# Patient Record
Sex: Male | Born: 1935 | Race: Black or African American | Hispanic: No | Marital: Married | Smoking: Former smoker
Health system: Southern US, Community
[De-identification: ages and names within clinical notes are randomized; demographics above are authoritative.]

## PROBLEM LIST (undated history)

## (undated) DIAGNOSIS — G709 Myoneural disorder, unspecified: Secondary | ICD-10-CM

## (undated) DIAGNOSIS — C679 Malignant neoplasm of bladder, unspecified: Secondary | ICD-10-CM

## (undated) DIAGNOSIS — M1990 Unspecified osteoarthritis, unspecified site: Secondary | ICD-10-CM

## (undated) DIAGNOSIS — E119 Type 2 diabetes mellitus without complications: Secondary | ICD-10-CM

## (undated) DIAGNOSIS — I714 Abdominal aortic aneurysm, without rupture, unspecified: Secondary | ICD-10-CM

## (undated) DIAGNOSIS — N400 Enlarged prostate without lower urinary tract symptoms: Secondary | ICD-10-CM

## (undated) DIAGNOSIS — N189 Chronic kidney disease, unspecified: Secondary | ICD-10-CM

## (undated) DIAGNOSIS — I10 Essential (primary) hypertension: Secondary | ICD-10-CM

## (undated) HISTORY — DX: Type 2 diabetes mellitus without complications: E11.9

## (undated) HISTORY — PX: TONSILLECTOMY: SUR1361

## (undated) HISTORY — DX: Malignant neoplasm of bladder, unspecified: C67.9

## (undated) HISTORY — PX: EYE SURGERY: SHX253

## (undated) HISTORY — PX: APPENDECTOMY: SHX54

## (undated) HISTORY — DX: Benign prostatic hyperplasia without lower urinary tract symptoms: N40.0

---

## 2001-04-14 ENCOUNTER — Emergency Department (HOSPITAL_COMMUNITY): Admission: EM | Admit: 2001-04-14 | Discharge: 2001-04-14 | Payer: Self-pay | Admitting: Emergency Medicine

## 2001-04-14 ENCOUNTER — Encounter: Payer: Self-pay | Admitting: Emergency Medicine

## 2001-04-20 ENCOUNTER — Emergency Department (HOSPITAL_COMMUNITY): Admission: EM | Admit: 2001-04-20 | Discharge: 2001-04-20 | Payer: Self-pay | Admitting: *Deleted

## 2004-02-02 ENCOUNTER — Encounter: Admission: RE | Admit: 2004-02-02 | Discharge: 2004-02-02 | Payer: Self-pay | Admitting: Endocrinology

## 2004-02-15 ENCOUNTER — Encounter: Admission: RE | Admit: 2004-02-15 | Discharge: 2004-02-15 | Payer: Self-pay | Admitting: Urology

## 2004-02-16 ENCOUNTER — Ambulatory Visit (HOSPITAL_COMMUNITY): Admission: RE | Admit: 2004-02-16 | Discharge: 2004-02-16 | Payer: Self-pay | Admitting: Urology

## 2004-02-16 ENCOUNTER — Ambulatory Visit (HOSPITAL_BASED_OUTPATIENT_CLINIC_OR_DEPARTMENT_OTHER): Admission: RE | Admit: 2004-02-16 | Discharge: 2004-02-16 | Payer: Self-pay | Admitting: Urology

## 2004-03-06 ENCOUNTER — Emergency Department (HOSPITAL_COMMUNITY): Admission: EM | Admit: 2004-03-06 | Discharge: 2004-03-06 | Payer: Self-pay | Admitting: Emergency Medicine

## 2004-05-31 ENCOUNTER — Ambulatory Visit (HOSPITAL_BASED_OUTPATIENT_CLINIC_OR_DEPARTMENT_OTHER): Admission: RE | Admit: 2004-05-31 | Discharge: 2004-05-31 | Payer: Self-pay | Admitting: Urology

## 2004-05-31 ENCOUNTER — Ambulatory Visit (HOSPITAL_COMMUNITY): Admission: RE | Admit: 2004-05-31 | Discharge: 2004-05-31 | Payer: Self-pay | Admitting: Urology

## 2010-11-20 ENCOUNTER — Inpatient Hospital Stay (HOSPITAL_COMMUNITY)
Admission: EM | Admit: 2010-11-20 | Discharge: 2010-11-22 | DRG: 872 | Disposition: A | Discharge status: Home / Self Care | Payer: 59 | Attending: Pulmonary Disease | Admitting: Pulmonary Disease

## 2010-11-20 ENCOUNTER — Emergency Department (HOSPITAL_COMMUNITY): Payer: 59

## 2010-11-20 ENCOUNTER — Inpatient Hospital Stay (HOSPITAL_COMMUNITY): Payer: 59

## 2010-11-20 DIAGNOSIS — J069 Acute upper respiratory infection, unspecified: Secondary | ICD-10-CM | POA: Diagnosis present

## 2010-11-20 DIAGNOSIS — E119 Type 2 diabetes mellitus without complications: Secondary | ICD-10-CM | POA: Diagnosis present

## 2010-11-20 DIAGNOSIS — I2510 Atherosclerotic heart disease of native coronary artery without angina pectoris: Secondary | ICD-10-CM | POA: Diagnosis present

## 2010-11-20 DIAGNOSIS — I10 Essential (primary) hypertension: Secondary | ICD-10-CM | POA: Diagnosis present

## 2010-11-20 DIAGNOSIS — A419 Sepsis, unspecified organism: Secondary | ICD-10-CM | POA: Diagnosis present

## 2010-11-20 DIAGNOSIS — E860 Dehydration: Secondary | ICD-10-CM | POA: Diagnosis present

## 2010-11-20 DIAGNOSIS — R031 Nonspecific low blood-pressure reading: Secondary | ICD-10-CM | POA: Diagnosis present

## 2010-11-20 DIAGNOSIS — N179 Acute kidney failure, unspecified: Secondary | ICD-10-CM | POA: Diagnosis present

## 2010-11-20 LAB — DIFFERENTIAL
Basophils Absolute: 0 10*3/uL (ref 0.0–0.1)
Basophils Relative: 0 % (ref 0–1)
Eosinophils Absolute: 0 10*3/uL (ref 0.0–0.7)
Lymphs Abs: 0.5 10*3/uL — ABNORMAL LOW (ref 0.7–4.0)
Monocytes Relative: 4 % (ref 3–12)
Neutro Abs: 13.9 10*3/uL — ABNORMAL HIGH (ref 1.7–7.7)
Neutrophils Relative %: 93 % — ABNORMAL HIGH (ref 43–77)

## 2010-11-20 LAB — GLUCOSE, CAPILLARY
Glucose-Capillary: 89 mg/dL (ref 70–99)
Glucose-Capillary: 78 mg/dL (ref 70–99)
Glucose-Capillary: 154 mg/dL — ABNORMAL HIGH (ref 70–99)
Glucose-Capillary: 151 mg/dL — ABNORMAL HIGH (ref 70–99)

## 2010-11-20 LAB — POCT I-STAT, CHEM 8
BUN: 35 mg/dL — ABNORMAL HIGH (ref 6–23)
Calcium, Ion: 1.05 mmol/L — ABNORMAL LOW (ref 1.12–1.32)
Chloride: 103 mEq/L (ref 96–112)
Creatinine, Ser: 2.2 mg/dL — ABNORMAL HIGH (ref 0.50–1.35)
Glucose, Bld: 108 mg/dL — ABNORMAL HIGH (ref 70–99)
Sodium: 135 mEq/L (ref 135–145)

## 2010-11-20 LAB — CBC
HCT: 28 % — ABNORMAL LOW (ref 39.0–52.0)
HCT: 28.4 % — ABNORMAL LOW (ref 39.0–52.0)
Hemoglobin: 10 g/dL — ABNORMAL LOW (ref 13.0–17.0)
Hemoglobin: 10 g/dL — ABNORMAL LOW (ref 13.0–17.0)
MCH: 30.6 pg (ref 26.0–34.0)
MCH: 30.5 pg (ref 26.0–34.0)
MCHC: 35.7 g/dL (ref 30.0–36.0)
MCHC: 35.2 g/dL (ref 30.0–36.0)
MCV: 85.6 fL (ref 78.0–100.0)
MCV: 86.6 fL (ref 78.0–100.0)
Platelets: 137 10*3/uL — ABNORMAL LOW (ref 150–400)
Platelets: 143 10*3/uL — ABNORMAL LOW (ref 150–400)
RBC: 3.27 MIL/uL — ABNORMAL LOW (ref 4.22–5.81)
RBC: 3.28 MIL/uL — ABNORMAL LOW (ref 4.22–5.81)
RDW: 13.5 % (ref 11.5–15.5)
WBC: 15 10*3/uL — ABNORMAL HIGH (ref 4.0–10.5)
WBC: 23.9 10*3/uL — ABNORMAL HIGH (ref 4.0–10.5)

## 2010-11-20 LAB — BASIC METABOLIC PANEL
BUN: 32 mg/dL — ABNORMAL HIGH (ref 6–23)
CO2: 22 mEq/L (ref 19–32)
Calcium: 7.8 mg/dL — ABNORMAL LOW (ref 8.4–10.5)
Chloride: 105 mEq/L (ref 96–112)
Creatinine, Ser: 1.75 mg/dL — ABNORMAL HIGH (ref 0.50–1.35)
GFR calc Af Amer: 45 mL/min — ABNORMAL LOW (ref >60)
GFR calc non Af Amer: 37 mL/min — ABNORMAL LOW (ref >60)
Glucose, Bld: 77 mg/dL (ref 70–99)
Potassium: 3.7 mEq/L (ref 3.5–5.1)
Sodium: 135 mEq/L (ref 135–145)

## 2010-11-20 LAB — URINE MICROSCOPIC-ADD ON

## 2010-11-20 LAB — URINALYSIS, ROUTINE W REFLEX MICROSCOPIC
Glucose, UA: NEGATIVE mg/dL
Ketones, ur: NEGATIVE mg/dL
Leukocytes, UA: NEGATIVE
Nitrite: NEGATIVE
Protein, ur: 100 mg/dL — AB
Specific Gravity, Urine: 1.02 (ref 1.005–1.030)
Urobilinogen, UA: 2 mg/dL — ABNORMAL HIGH (ref 0.0–1.0)
pH: 5.5 (ref 5.0–8.0)

## 2010-11-20 LAB — POCT I-STAT TROPONIN I: Troponin i, poc: 0.15 ng/mL (ref 0.00–0.08)

## 2010-11-20 LAB — TROPONIN I: Troponin I: 0.66 ng/mL (ref <0.30)

## 2010-11-20 LAB — LACTIC ACID, PLASMA: Lactic Acid, Venous: 1 mmol/L (ref 0.5–2.2)

## 2010-11-21 ENCOUNTER — Inpatient Hospital Stay (HOSPITAL_COMMUNITY): Payer: 59

## 2010-11-21 DIAGNOSIS — R4182 Altered mental status, unspecified: Secondary | ICD-10-CM

## 2010-11-21 DIAGNOSIS — A419 Sepsis, unspecified organism: Secondary | ICD-10-CM

## 2010-11-21 LAB — URINE CULTURE: Colony Count: NO GROWTH

## 2010-11-21 LAB — GLUCOSE, CAPILLARY
Glucose-Capillary: 140 mg/dL — ABNORMAL HIGH (ref 70–99)
Glucose-Capillary: 150 mg/dL — ABNORMAL HIGH (ref 70–99)
Glucose-Capillary: 158 mg/dL — ABNORMAL HIGH (ref 70–99)
Glucose-Capillary: 163 mg/dL — ABNORMAL HIGH (ref 70–99)
Glucose-Capillary: 176 mg/dL — ABNORMAL HIGH (ref 70–99)

## 2010-11-21 LAB — BASIC METABOLIC PANEL
Calcium: 7.7 mg/dL — ABNORMAL LOW (ref 8.4–10.5)
Creatinine, Ser: 1.56 mg/dL — ABNORMAL HIGH (ref 0.50–1.35)
GFR calc Af Amer: 51 mL/min — ABNORMAL LOW (ref >60)
Potassium: 3.8 mEq/L (ref 3.5–5.1)
Sodium: 138 mEq/L (ref 135–145)

## 2010-11-21 LAB — CBC
MCH: 30.2 pg (ref 26.0–34.0)
MCHC: 35.1 g/dL (ref 30.0–36.0)
MCV: 86.2 fL (ref 78.0–100.0)
Platelets: 147 10*3/uL — ABNORMAL LOW (ref 150–400)
RDW: 14 % (ref 11.5–15.5)

## 2010-11-22 LAB — BASIC METABOLIC PANEL
CO2: 22 mEq/L (ref 19–32)
Calcium: 8.1 mg/dL — ABNORMAL LOW (ref 8.4–10.5)
GFR calc Af Amer: 54 mL/min — ABNORMAL LOW (ref >60)
GFR calc non Af Amer: 45 mL/min — ABNORMAL LOW (ref >60)
Sodium: 137 mEq/L (ref 135–145)

## 2010-11-22 LAB — CBC
MCH: 30.2 pg (ref 26.0–34.0)
Platelets: 190 10*3/uL (ref 150–400)
RBC: 3.31 MIL/uL — ABNORMAL LOW (ref 4.22–5.81)
RDW: 14.2 % (ref 11.5–15.5)

## 2010-11-22 LAB — GLUCOSE, CAPILLARY
Glucose-Capillary: 154 mg/dL — ABNORMAL HIGH (ref 70–99)
Glucose-Capillary: 169 mg/dL — ABNORMAL HIGH (ref 70–99)

## 2010-11-26 LAB — CULTURE, BLOOD (ROUTINE X 2)
Culture  Setup Time: 201209030831
Culture: NO GROWTH

## 2010-12-02 NOTE — Discharge Summary (Signed)
NAMEMarland Kitchen  Alexis Lopez, Alexis Lopez NO.:  [id number]  MEDICAL RECORD NO.:  [id number]  LOCATION:  6531                         FACILITY:  [facility]  PHYSICIAN:  Charlaine Dalton. Sherene Sires, MD, FCCPDATE OF BIRTH:  1921/01/22  DATE OF ADMISSION:  11/20/2010 DATE OF DISCHARGE:  11/22/2010                              DISCHARGE SUMMARY   DISCHARGE DIAGNOSES: 1. Systemic inflammatory response syndrome/sepsis. 2. Upper respiratory infection. 3. Acute renal failure. 4. Diabetes. 5. Hypertension.  HISTORY OF PRESENT ILLNESS:  Alexis Lopez is an 77 year old male with a history of diabetes and hypertension who presented on November 20, 2010 with fever and altered mental status which was transient and quickly resolved.  The patient also had transient hypotension in the emergency room and was therefore admitted to Pulmonary Critical Care with questionable sepsis.  LABORATORY DATA:  At the time of admission, CBC, white blood cells 15.0, hemoglobin 10.0, hematocrit 28, and platelets 137.  Urinalysis negative. Basic metabolic panel:  Sodium 135, potassium 4.0, glucose 108, BUN 35, and creatinine 2.2.  Lactic acid 1.0, troponin 0.66.  Procalcitonin 5.34.  Most recent laboratory data, November 22, 2010:  Basic metabolic panel:  Sodium 137, potassium 3.6, BUN 27, creatinine 1.48, and glucose 155.  CBC:  White blood cells 16.1, hemoglobin 10.0, hematocrit 28.4, and platelets 190.  MICRO DATA:  Urine culture:  Final report is no growth.  Blood cultures x2, preliminary report, no growth.  RADIOLOGIC DATA:  At the time of admission, portable chest x-ray shows infiltrate or atelectasis in the left lung base.  November 21, 2010 portable chest x-ray shows improved aeration of both lungs.  HOSPITAL COURSE BY DISCHARGE DIAGNOSES: 1. SIRS/sepsis was mild and is now resolved, likely secondary to upper     respiratory infection.  The patient did have some transient altered     mental status which was resolved  essentially by the time of     admission.  The patient was treated with IV antibiotics including     vancomycin and Zosyn and has been transitioned to p.o. antibiotics.     Please see next problem.  Blood pressure is back to his baseline.     Procalcitonin was 5.3 but lactate was negative. 2. Upper respiratory infection, question viral versus bronchitis.     Again, the patient was initially treated with IV antibiotics and     has been transitioned to p.o. Levaquin which he will continue for 5     days for a total of 7 days.  Procalcitonin was mildly elevated.     Lactate was negative.  The patient is afebrile at this time.  White     blood cells are trending down and respiratory status is stable on     room air. 3. Acute renal failure likely secondary to sepsis and dehydration,     appears resolved.  The patient's baseline creatinine actually     appears to be about 1.8 from 2006, creatinine today is 1.48 down     from 1.7.  The patient's Levaquin has been dosed at 250 mg p.o.     daily secondary to creatinine clearance. 4. Diabetes.  The patient was treated as  an inpatient with sliding     scale insulin and should continue his home insulin regimen and     follow up with his new primary care physician, Dr. Valentina Lucks. 5. Hypertension.  Again, the patient had some mild transient     hypotension which is now resolved.  She has been restarted on his     home antihypertensives except for his     triamterene/hydrochlorothiazide which should continue to be held in     the setting of questionable renal failure and unknown baseline     creatinine.  DISCHARGE MEDICATIONS: 1. Levaquin 250 mg p.o. q.24 h. x5 days. 2. Amlodipine 5 mg p.o. daily. 3. Aspirin 81 mg p.o. daily. 4. Doxazosin 8 mg p.o. daily. 5. Finasteride 5 mg p.o. daily. 6. Flomax 0.4 mg p.o. daily. 7. Insulin NPH 36 units subcutaneously twice daily before meals. 8. Regular insulin 14 units subcu twice daily before meals. 9. Zocor  20 mg p.o. at bedtime. 10.Vitamin D3 1000 units p.o. daily.  DISCHARGE ACTIVITY:  The patient should increase his activity slowly.  DISCHARGE DIET:  Low-sodium, heart-healthy and carbohydrate modified diabetic diet.  FOLLOWUP APPOINTMENTS:  The patient should follow up with Dr. Kirby Funk.  He has previously made an appointment to establish care with Dr. Valentina Lucks and this appointment is not until December.  I have spoken with the office which will call Alexis Lopez to set up a sooner appointment.  DISPOSITION:  The patient has met maximum benefit from his inpatient hospitalization.  He is medically cleared and ready for discharge to home.  He should follow up as an outpatient with Dr. Valentina Lucks for his diabetes and hypertension.  He will continue 5 further days of p.o. antibiotics for suspected upper respiratory infection.  TIME SPENT:  Greater 30 minutes were spent on this discharge.     Dirk Dress, NP   ______________________________ Charlaine Dalton. Sherene Sires, MD, FCCP    KW/MEDQ  D:  11/22/2010  T:  11/22/2010  Job:  [id number]  cc:   Thora Lance, M.D.  Electronically Signed by Danford Bad N.P. on 11/30/2010 03:28:38 PM Electronically Signed by Sandrea Hughs MD FCCP on 12/02/2010 08:25:07 AM

## 2012-04-11 ENCOUNTER — Other Ambulatory Visit: Payer: Self-pay | Admitting: Internal Medicine

## 2012-04-11 ENCOUNTER — Ambulatory Visit
Admission: RE | Admit: 2012-04-11 | Discharge: 2012-04-11 | Disposition: A | Discharge status: Home / Self Care | Payer: 59 | Source: Ambulatory Visit | Attending: Internal Medicine | Admitting: Internal Medicine

## 2012-04-11 DIAGNOSIS — R413 Other amnesia: Secondary | ICD-10-CM

## 2012-11-03 ENCOUNTER — Other Ambulatory Visit: Payer: Self-pay | Admitting: *Deleted

## 2012-11-03 MED ORDER — CALCITRIOL 0.25 MCG PO CAPS: 0.2500 ug | ORAL_CAPSULE | Freq: Every day | ORAL | Status: DC

## 2012-11-11 ENCOUNTER — Telehealth: Payer: Self-pay | Admitting: Endocrinology

## 2012-11-11 NOTE — Telephone Encounter (Signed)
Spoke with wife about calcitriol, instructions given, wife is aware

## 2012-11-20 ENCOUNTER — Other Ambulatory Visit: Payer: Self-pay | Admitting: *Deleted

## 2012-11-21 ENCOUNTER — Other Ambulatory Visit: Payer: Self-pay | Admitting: *Deleted

## 2012-11-21 DIAGNOSIS — E119 Type 2 diabetes mellitus without complications: Secondary | ICD-10-CM | POA: Insufficient documentation

## 2012-11-24 ENCOUNTER — Other Ambulatory Visit: Payer: 59

## 2012-11-24 ENCOUNTER — Ambulatory Visit: Payer: 59 | Admitting: Endocrinology

## 2013-01-23 ENCOUNTER — Other Ambulatory Visit (INDEPENDENT_AMBULATORY_CARE_PROVIDER_SITE_OTHER): Payer: 59

## 2013-01-23 DIAGNOSIS — E119 Type 2 diabetes mellitus without complications: Secondary | ICD-10-CM

## 2013-01-23 LAB — COMPREHENSIVE METABOLIC PANEL
AST: 26 U/L (ref 0–37)
Alkaline Phosphatase: 76 U/L (ref 39–117)
BUN: 18 mg/dL (ref 6–23)
Creatinine, Ser: 1.7 mg/dL — ABNORMAL HIGH (ref 0.4–1.5)
Total Bilirubin: 0.9 mg/dL (ref 0.3–1.2)

## 2013-01-23 LAB — URINALYSIS
Hgb urine dipstick: NEGATIVE
Leukocytes, UA: NEGATIVE
Specific Gravity, Urine: 1.02 (ref 1.000–1.030)
Urobilinogen, UA: 0.2 (ref 0.0–1.0)

## 2013-01-23 LAB — MICROALBUMIN / CREATININE URINE RATIO
Creatinine,U: 125.1 mg/dL
Microalb Creat Ratio: 10.5 mg/g (ref 0.0–30.0)
Microalb, Ur: 13.1 mg/dL — ABNORMAL HIGH (ref 0.0–1.9)

## 2013-01-28 ENCOUNTER — Encounter: Payer: Self-pay | Admitting: *Deleted

## 2013-01-28 ENCOUNTER — Ambulatory Visit: Payer: 59 | Admitting: Endocrinology

## 2013-03-02 ENCOUNTER — Other Ambulatory Visit (INDEPENDENT_AMBULATORY_CARE_PROVIDER_SITE_OTHER): Payer: 59

## 2013-03-02 ENCOUNTER — Other Ambulatory Visit: Payer: Self-pay | Admitting: *Deleted

## 2013-03-02 DIAGNOSIS — E119 Type 2 diabetes mellitus without complications: Secondary | ICD-10-CM

## 2013-03-02 LAB — URINALYSIS
Bilirubin Urine: NEGATIVE
Hgb urine dipstick: NEGATIVE
Ketones, ur: NEGATIVE
Urine Glucose: NEGATIVE
Urobilinogen, UA: 0.2 (ref 0.0–1.0)

## 2013-03-02 LAB — COMPREHENSIVE METABOLIC PANEL
AST: 24 U/L (ref 0–37)
Albumin: 3.8 g/dL (ref 3.5–5.2)
Alkaline Phosphatase: 79 U/L (ref 39–117)
BUN: 21 mg/dL (ref 6–23)
Potassium: 4.4 mEq/L (ref 3.5–5.1)
Sodium: 138 mEq/L (ref 135–145)
Total Bilirubin: 0.8 mg/dL (ref 0.3–1.2)
Total Protein: 6.9 g/dL (ref 6.0–8.3)

## 2013-03-02 LAB — LIPID PANEL
HDL: 33.9 mg/dL — ABNORMAL LOW (ref >39.00)
LDL Cholesterol: 118 mg/dL — ABNORMAL HIGH (ref 0–99)
Total CHOL/HDL Ratio: 5

## 2013-03-02 LAB — MICROALBUMIN / CREATININE URINE RATIO
Creatinine,U: 136.7 mg/dL
Microalb Creat Ratio: 10.5 mg/g (ref 0.0–30.0)
Microalb, Ur: 14.4 mg/dL — ABNORMAL HIGH (ref 0.0–1.9)

## 2013-03-04 ENCOUNTER — Ambulatory Visit (INDEPENDENT_AMBULATORY_CARE_PROVIDER_SITE_OTHER): Payer: 59 | Admitting: Endocrinology

## 2013-03-04 ENCOUNTER — Encounter: Payer: Self-pay | Admitting: Endocrinology

## 2013-03-04 VITALS — BP 164/72 | HR 66 | Temp 98.2°F | Resp 12 | Ht 66.0 in | Wt 189.9 lb

## 2013-03-04 DIAGNOSIS — I10 Essential (primary) hypertension: Secondary | ICD-10-CM

## 2013-03-04 DIAGNOSIS — E119 Type 2 diabetes mellitus without complications: Secondary | ICD-10-CM

## 2013-03-04 DIAGNOSIS — E785 Hyperlipidemia, unspecified: Secondary | ICD-10-CM | POA: Insufficient documentation

## 2013-03-04 NOTE — Patient Instructions (Addendum)
Please check blood sugars at least half the time about 2 hours after any meal and every 2 days on waking up.  Please bring blood sugar monitor to each visit  Check BP meds and call if on all

## 2013-03-04 NOTE — Progress Notes (Signed)
Patient ID: Alexis Lopez [facility], male   DOB: 26-Jul-1920, 79 y.o.   MRN: [id number]  Reason for Appointment: Diabetes follow-up   History of Present Illness   Diagnosis: Type 2 DIABETES MELITUS, date of diagnosis: 1980  Previous history: He has been on insulin for several years and on the same regimen of NPH and regular insulin This apparently has provided good control of his diabetes with fairly stable doses Previous records are not available  Recent history: He is concerned because his fasting blood sugars have been high for the last month or so. However his lab glucose and A1c are excellent He has not brought his blood sugar record and [facility]  He says that regard the strips about a year ago and does not know about expiration date Also is checking his blood sugars only in the morning     Oral hypoglycemic drugs: None        Side effects from medications: None Insulin regimen: 36 NPH 14 regular twice a day, usually right before eating         Proper timing of medications in relation to meals: Yes.          Monitors blood glucose: Once a day.    Glucometer: Precision           Blood Glucose readings from recall: readings before breakfast: 150-180  Hypoglycemia: None for quite some time         Meals: 3 meals per day.          Physical activity:  occasionally walking on treadmill           Weight control:  Wt Readings from Last 3 Encounters:  03/04/13 189 lb 14.4 oz (86.138 kg)        Last eye exam: 03/02/13, apparently no retinopathy  Complications:   nephropathy   Diabetes labs:  Lab Results  Component Value Date   HGBA1C 6.4 03/02/2013   HGBA1C 6.6* 01/23/2013   Lab Results  Component Value Date   MICROALBUR 14.4* 03/02/2013   LDLCALC 118* 03/02/2013   CREATININE 1.8* 03/02/2013    PROBLEM 2: Hypertension. He says he is taking half tablet of amlodipine 10 mg. Previous records are not available for confirmation. Blood  pressure is unusually high today. Does not monitor at home He does not think he is taking Maxzide which is on his list. He is taking Cardura. It is a little unsure about his medications and does not have his current list on him     Medication List       This list is accurate as of: 03/04/13  3:27 PM.  Always use your most recent med list.               amLODipine 10 MG tablet  Commonly known as:  NORVASC  Take 10 mg by mouth daily.     aspirin 81 MG tablet  Take 81 mg by mouth daily.     calcitRIOL 0.25 MCG capsule  Commonly known as:  ROCALTROL  Take 1 capsule (0.25 mcg total) by mouth daily.     cholecalciferol 400 UNITS Tabs tablet  Commonly known as:  VITAMIN D  Take 400 Units by mouth.     donepezil 10 MG tablet  Commonly known as:  ARICEPT  Take 10 mg by mouth at bedtime.     doxazosin 8 MG tablet  Commonly known as:  CARDURA  Take 8 mg by  mouth daily.     insulin NPH 100 UNIT/ML injection  Commonly known as:  HUMULIN N,NOVOLIN N  Inject 36 Units into the skin 2 (two) times daily.     insulin regular 100 units/mL injection  Commonly known as:  NOVOLIN R,HUMULIN R  Inject 14 Units into the skin 2 (two) times daily before a meal.     simvastatin 40 MG tablet  Commonly known as:  ZOCOR  Take 40 mg by mouth daily.     tamsulosin 0.4 MG Caps capsule  Commonly known as:  FLOMAX  Take 0.4 mg by mouth.     triamterene-hydrochlorothiazide 75-50 MG per tablet  Commonly known as:  MAXZIDE  Take 1 tablet by mouth daily.        Allergies: No Known Allergies  No past medical history on file.  No past surgical history on file.  No family history on file.  Social History:  reports that he has quit smoking. He started smoking about 44 years ago. He has never used smokeless tobacco. His alcohol and drug histories are not on file.  Above information will be completed when previous records are available  Review of Systems:   Has history of chronic renal  dysfunction followed by nephrologist periodically. Current GFR is 45  Lipids: He has [facility] been [facility] and LDL is high, previous records not available. Usually has no side effects with this  No complaints of numbness or tingling in his feet. He thinks he is followed by a podiatrist occasionally  No recent swelling of the feet. Asking about dry  skin on his feet  He is being treated by his PCP for memory loss with Aricept   Is being followed by a urologist for BPH     LABS:  Appointment on 03/02/2013  Component Date Value Range Status  . Sodium 03/02/2013 138  135 - 145 mEq/L Final  . Potassium 03/02/2013 4.4  3.5 - 5.1 mEq/L Final  . Chloride 03/02/2013 105  96 - 112 mEq/L Final  . CO2 03/02/2013 26  19 - 32 mEq/L Final  . Glucose, Bld 03/02/2013 116* 70 - 99 mg/dL Final  . BUN 40/98/1191 21  6 - 23 mg/dL Final  . Creatinine, Ser 03/02/2013 1.8* 0.4 - 1.5 mg/dL Final  . Total Bilirubin 03/02/2013 0.8  0.3 - 1.2 mg/dL Final  . Alkaline Phosphatase 03/02/2013 79  39 - 117 U/L Final  . AST 03/02/2013 24  0 - 37 U/L Final  . ALT 03/02/2013 13  0 - 53 U/L Final  . Total Protein 03/02/2013 6.9  6.0 - 8.3 g/dL Final  . Albumin 47/82/9562 3.8  3.5 - 5.2 g/dL Final  . Calcium 13/10/6576 8.8  8.4 - 10.5 mg/dL Final  . GFR 46/96/2952 45.23* >60.00 mL/min Final  . Color, Urine 03/02/2013 YELLOW  Yellow;Lt. Yellow Final  . APPearance 03/02/2013 CLEAR  Clear Final  . Specific Gravity, Urine 03/02/2013 1.020  1.000-1.030 Final  . pH 03/02/2013 5.5  5.0 - 8.0 Final  . Total Protein, Urine 03/02/2013 TRACE  Negative Final  . Urine Glucose 03/02/2013 NEGATIVE  Negative Final  . Ketones, ur 03/02/2013 NEGATIVE  Negative Final  . Bilirubin Urine 03/02/2013 NEGATIVE  Negative Final  . Hgb urine dipstick 03/02/2013 NEGATIVE  Negative Final  . Urobilinogen, UA 03/02/2013 0.2  0.0 - 1.0 Final  . Leukocytes, UA 03/02/2013 NEGATIVE   Negative Final  .  Nitrite 03/02/2013 NEGATIVE  Negative Final  . Cholesterol 03/02/2013 179  0 - 200 mg/dL Final   ATP III Classification       Desirable:  < 200 mg/dL               Borderline High:  200 - 239 mg/dL          High:  > = 213 mg/dL  . Triglycerides 03/02/2013 135.0  0.0 - 149.0 mg/dL Final   Normal:  <086 mg/dLBorderline High:  150 - 199 mg/dL  . HDL 03/02/2013 33.90* >39.00 mg/dL Final  . VLDL 57/84/6962 27.0  0.0 - 40.0 mg/dL Final  . LDL Cholesterol 03/02/2013 118* 0 - 99 mg/dL Final  . Total CHOL/HDL Ratio 03/02/2013 5   Final                  Men          Women1/2 Average Risk     3.4          3.3Average Risk          5.0          4.42X Average Risk          9.6          7.13X Average Risk          15.0          11.0                      . Microalb, Ur 03/02/2013 14.4* 0.0 - 1.9 mg/dL Final  . Creatinine,U 95/28/4132 136.7   Final  . Microalb Creat Ratio 03/02/2013 10.5  0.0 - 30.0 mg/g Final  . Hemoglobin A1C 03/02/2013 6.4  4.6 - 6.5 % Final   Glycemic Control Guidelines for People with Diabetes:Non Diabetic:  <6%Goal of Therapy: <7%Additional Action Suggested:  >8%      Examination:   BP 164/72  Pulse 66  Temp(Src) 98.2 F (36.8 C)  Resp 12  Ht 5\' 6"  (1.676 m)  Wt 189 lb 14.4 oz (86.138 kg)  BMI 30.67 kg/m2  SpO2 98%  Body mass index is 30.67 kg/(m^2).    Diabetic foot exam done No pedal edema seen  ASSESSMENT/ PLAN::    Diabetes type 2:    Blood glucose control appears excellent with upper -normal A1c and good blood sugar in the lab. He thinks his fasting readings are much higher but he probably is using expired [facility] Also appears that he is not checking his readings nonfasting and not keeping a record Is taking relatively high dose of insulin despite his renal dysfunction but has had no reported hypoglycemia  Have shown him how to use an Accu-Chek monitor and he will start getting the strips from the  local drugstore. Emphasized the need for taking some readings about 2 hours after meals and bring his meter for review in 1 month Meanwhile will continue the same insulin regimen, consider reducing the dose if blood sugars are low normal  Complications: Probable nephropathy. No evidence of significant neuropathy today. Discussed foot care principles  HYPERTENSION: He will take a full tablet of 10 mg Norvasc and bring his medications for review on the next visit  HYPERLIPIDEMIA: LDL is high, he will get his simvastatin refilled and take regularly  Incomplete records: His previous records are being requested and his note will be updated when this is available  Counseling time over 50% of today's 25 minute visit   Teriyah Purington 03/04/2013, 3:27 PM

## 2013-03-05 ENCOUNTER — Ambulatory Visit: Payer: 59 | Admitting: Endocrinology

## 2013-04-01 ENCOUNTER — Ambulatory Visit (INDEPENDENT_AMBULATORY_CARE_PROVIDER_SITE_OTHER): Payer: 59 | Admitting: Endocrinology

## 2013-04-01 ENCOUNTER — Other Ambulatory Visit: Payer: Self-pay | Admitting: *Deleted

## 2013-04-01 ENCOUNTER — Encounter: Payer: Self-pay | Admitting: Endocrinology

## 2013-04-01 VITALS — BP 128/52 | HR 64 | Temp 98.3°F | Resp 12 | Ht 66.0 in | Wt 191.1 lb

## 2013-04-01 DIAGNOSIS — E785 Hyperlipidemia, unspecified: Secondary | ICD-10-CM

## 2013-04-01 DIAGNOSIS — I10 Essential (primary) hypertension: Secondary | ICD-10-CM

## 2013-04-01 DIAGNOSIS — E119 Type 2 diabetes mellitus without complications: Secondary | ICD-10-CM

## 2013-04-01 DIAGNOSIS — N183 Chronic kidney disease, stage 3 unspecified: Secondary | ICD-10-CM

## 2013-04-01 MED ORDER — ACCU-CHEK FASTCLIX LANCETS MISC: Status: AC

## 2013-04-01 MED ORDER — GLUCOSE BLOOD VI STRP
ORAL_STRIP | Status: DC
Start: 2013-04-01 — End: 2013-12-07

## 2013-04-01 NOTE — Patient Instructions (Addendum)
Doxazosin, 1 daily and 1/2 Amlodipine 10mg  daily  Take Simvastatin daily

## 2013-04-01 NOTE — Progress Notes (Signed)
Patient ID: Alexis Lopez [facility], male   DOB: 11-06-1920, 80 y.o.   MRN: [id number]  Reason for Appointment: Diabetes follow-up   History of Present Illness   Diagnosis: Type 2 DIABETES MELITUS, date of diagnosis: 1980  Previous history: He has been on insulin for several years and on the same regimen of NPH and regular insulin This apparently has provided good control of his diabetes with fairly stable doses  A1c in 6/14 was 6.2  Recent history: He was apparently having falsely high readings on his last visit because of using expired strips  However his lab glucose and A1c are excellent He  again has not brought his blood sugar record and [facility]  His blood sugars seem to be better He says he is doing very well and trying to walk periodically also  He is checking his blood sugars only in the morning and sometimes before supper      Oral hypoglycemic drugs: None   Insulin regimen: 36 NPH 14 regular twice a day, usually right before eating         Proper timing of medications in relation to meals: Yes.          Monitors blood glucose: Once a day.    Glucometer: Precision; using new strips           Blood Glucose readings from recall: readings before breakfast: 140-145 am , supper about 130  Hypoglycemia: None for quite some time         Meals: 3 meals per day.          Physical activity:  occasionally walking on treadmill           Weight control:  Wt Readings from Last 3 Encounters:  04/01/13 191 lb 1.6 oz (86.682 kg)  03/04/13 189 lb 14.4 oz (86.138 kg)        Last eye exam: 03/02/13, apparently no retinopathy  Complications:  nephropathy/renal dysfunction, last microalbumin was 36   Diabetes labs:  Lab Results  Component Value Date   HGBA1C 6.4 03/02/2013   HGBA1C 6.6* 01/23/2013   Lab Results  Component Value Date   MICROALBUR 14.4* 03/02/2013   LDLCALC 118* 03/02/2013   CREATININE 1.8* 03/02/2013    PROBLEM  2: Hypertension. He was  taking half tablet of amlodipine 10 mg and.because of higher readings on the last visit this was increased to 10 mg.  Does not monitor  blood pressure at home He  is not sure if he is  taking Maxzide which is on his list. He is taking Cardura ? 8 mg. Blood pressure is improved     Medication List       This list is accurate as of: 04/01/13  5:08 PM.  Always use your most recent med list.               ACCU-CHEK FASTCLIX LANCETS Misc  Use to check blood sugars 2 times per day     amLODipine 10 MG tablet  Commonly known as:  NORVASC  Take 10 mg by mouth daily.     aspirin 81 MG tablet  Take 81 mg by mouth daily.     calcitRIOL 0.25 MCG capsule  Commonly known as:  ROCALTROL  Take 1 capsule (0.25 mcg total) by mouth daily.     cholecalciferol 400 UNITS Tabs tablet  Commonly known as:  VITAMIN D  Take 400 Units by mouth.  donepezil 10 MG tablet  Commonly known as:  ARICEPT  Take 10 mg by mouth at bedtime.     doxazosin 8 MG tablet  Commonly known as:  CARDURA  Take 8 mg by mouth daily.     glucose blood test strip  Commonly known as:  ACCU-CHEK SMARTVIEW  Use as instructed to check blood sugars 2 times per day, dx code 250.00     insulin NPH Human 100 UNIT/ML injection  Commonly known as:  HUMULIN N,NOVOLIN N  Inject 36 Units into the skin 2 (two) times daily.     insulin regular 100 units/mL injection  Commonly known as:  NOVOLIN R,HUMULIN R  Inject 14 Units into the skin 2 (two) times daily before a meal.     tamsulosin 0.4 MG Caps capsule  Commonly known as:  FLOMAX  Take 0.4 mg by mouth.     triamterene-hydrochlorothiazide 75-50 MG per tablet  Commonly known as:  MAXZIDE  Take 1 tablet by mouth daily.        Allergies: No Known Allergies  Past Medical History  Diagnosis Date  . BPH (benign prostatic hyperplasia)   . Bladder cancer     Past Surgical History  Procedure Laterality Date  . Eye surgery      Cataract     Family History  Problem Relation Age of Onset  . Diabetes Sister     Social History:  reports that he has quit smoking. He started smoking about 44 years ago. He has never used smokeless tobacco. His alcohol and drug histories are not on file.   Review of Systems:   Has history of chronic renal dysfunction followed by nephrologist periodically.  recent  GFR is 45  Lipids: He had been on pravastatin 20 mg for sometime but he is still unclear whether he has this at home. This does not appear on his list     LABS:  No visits with results within 1 Week(s) from this visit. Latest known visit with results is:  Office Visit on 03/04/2013  Component Date Value Range Status  . HM Diabetic Eye Exam 03/02/2013 Normal   Final     Examination:   BP 128/52  Pulse 64  Temp(Src) 98.3 F (36.8 C)  Resp 12  Ht 5\' 6"  (1.676 m)  Wt 191 lb 1.6 oz (86.682 kg)  BMI 30.86 kg/m2  SpO2 99%  Body mass index is 30.86 kg/(m^2).    Diabetic foot exam done No pedal edema seen  ASSESSMENT/ PLAN::    Diabetes type 2:    Blood glucose control  has been good and his recent readings are fairly good with using his new strips However have asked him to start using his Accu-Chek monitor and will send prescription for his strips This will be downloaded on the next visit Reminded him to check blood sugars at various times Meanwhile will continue the same insulin regimen, consider reducing the dose if blood sugars are low normal  HYPERTENSION: He will  continue same regimen but have him take only half of the 8 mg doxazosin because of his age and potential for orthostasis with this  HYPERLIPIDEMIA: LDL  recently  high, he will increase pravastatin to 40 mg   Alexis Lopez 04/01/2013, 5:08 PM

## 2013-05-29 ENCOUNTER — Other Ambulatory Visit (INDEPENDENT_AMBULATORY_CARE_PROVIDER_SITE_OTHER): Payer: 59

## 2013-05-29 DIAGNOSIS — E119 Type 2 diabetes mellitus without complications: Secondary | ICD-10-CM

## 2013-05-29 LAB — HEMOGLOBIN A1C: Hgb A1c MFr Bld: 6.6 % — ABNORMAL HIGH (ref 4.6–6.5)

## 2013-05-29 LAB — COMPREHENSIVE METABOLIC PANEL
ALT: 14 U/L (ref 0–53)
AST: 22 U/L (ref 0–37)
Albumin: 3.7 g/dL (ref 3.5–5.2)
Alkaline Phosphatase: 78 U/L (ref 39–117)
BUN: 21 mg/dL (ref 6–23)
CHLORIDE: 109 meq/L (ref 96–112)
CO2: 24 meq/L (ref 19–32)
CREATININE: 1.8 mg/dL — AB (ref 0.4–1.5)
Calcium: 8.9 mg/dL (ref 8.4–10.5)
GFR: 44.64 mL/min — AB (ref >60.00)
Glucose, Bld: 79 mg/dL (ref 70–99)
Potassium: 4.4 mEq/L (ref 3.5–5.1)
Sodium: 140 mEq/L (ref 135–145)
Total Bilirubin: 0.7 mg/dL (ref 0.3–1.2)
Total Protein: 6.9 g/dL (ref 6.0–8.3)

## 2013-05-29 LAB — LIPID PANEL
CHOLESTEROL: 147 mg/dL (ref 0–200)
HDL: 39.5 mg/dL (ref >39.00)
LDL Cholesterol: 94 mg/dL (ref 0–99)
Total CHOL/HDL Ratio: 4
Triglycerides: 67 mg/dL (ref 0.0–149.0)
VLDL: 13.4 mg/dL (ref 0.0–40.0)

## 2013-06-03 ENCOUNTER — Ambulatory Visit (INDEPENDENT_AMBULATORY_CARE_PROVIDER_SITE_OTHER): Payer: 59 | Admitting: Endocrinology

## 2013-06-03 ENCOUNTER — Encounter: Payer: Self-pay | Admitting: Endocrinology

## 2013-06-03 VITALS — BP 112/60 | HR 53 | Temp 98.5°F | Resp 14 | Ht 66.0 in | Wt 192.4 lb

## 2013-06-03 DIAGNOSIS — I10 Essential (primary) hypertension: Secondary | ICD-10-CM

## 2013-06-03 DIAGNOSIS — E119 Type 2 diabetes mellitus without complications: Secondary | ICD-10-CM

## 2013-06-03 DIAGNOSIS — E785 Hyperlipidemia, unspecified: Secondary | ICD-10-CM

## 2013-06-03 DIAGNOSIS — N183 Chronic kidney disease, stage 3 unspecified: Secondary | ICD-10-CM

## 2013-06-03 NOTE — Patient Instructions (Signed)
Please check blood sugars at least half the time about 2 hours after any meal and as directed on waking up. Please bring blood sugar monitor to each visit  Reduce triamterene-hydrochlorothiazide (MAXZIDE) 75-50 MG to 1/2

## 2013-06-03 NOTE — Progress Notes (Signed)
Patient ID: Alexis Lopez [facility], male   DOB: 08/14/1920, 80 y.o.   MRN: [id number]  Reason for Appointment: Diabetes follow-up   History of Present Illness   Diagnosis: Type 2 DIABETES MELITUS, date of diagnosis: 1980  Previous history: He has been on insulin for several years and on the same regimen of NPH and regular insulin This apparently has provided good control of his diabetes with fairly stable doses  A1c in 6/14 was 6.2  Recent history:  His blood sugars appear to be reasonably good at home although his record indicates only he is checking in the morning He thinks he forgets to write down the blood sugars in the afternoon Again is not checking any readings after meals He thinks he is checking glucose with his Accu-Chek meter now Again his lab glucose and A1c are good level He has been a little active but has not lost any weight  Oral hypoglycemic drugs: None   Insulin regimen: 36 NPH 14 regular twice a day, usually right before eating         Proper timing of medications in relation to meals: Yes.          Monitors blood glucose: Once a day.    Glucometer: ? Accu-Chek           Blood Glucose readings from home diary: readings before breakfast: 77-164, none on fasting readings recorded   Hypoglycemia: None for quite some time         Meals: 3 meals per day.          Physical activity:  occasionally walking on treadmill           Weight control:  Wt Readings from Last 3 Encounters:  06/03/13 192 lb 6.4 oz (87.272 kg)  04/01/13 191 lb 1.6 oz (86.682 kg)  03/04/13 189 lb 14.4 oz (86.138 kg)        Last eye exam: 03/02/13, apparently no retinopathy  Complications:  nephropathy/renal dysfunction, last microalbumin was 36   Diabetes labs:  Lab Results  Component Value Date   HGBA1C 6.6* 05/29/2013   HGBA1C 6.4 03/02/2013   HGBA1C 6.6* 01/23/2013   Lab Results  Component Value Date   MICROALBUR 14.4* 03/02/2013   LDLCALC 94 05/29/2013   CREATININE 1.8* 05/29/2013     PROBLEM 2: Hypertension. He is again unsure about what medications he is taking He was previously on indapamide but his medication list indicates he is now taking Maxzide 50 Also he is taking amlodipine; Cardura was reduced to half tablet on the last visit to avoid potential orthostasis Blood pressure is relatively low today, no lightheadedness on standing up and no monitoring at home     Medication List       This list is accurate as of: 06/03/13  1:27 PM.  Always use your most recent med list.               ACCU-CHEK FASTCLIX LANCETS Misc  Use to check blood sugars 2 times per day     amLODipine 10 MG tablet  Commonly known as:  NORVASC  Take 10 mg by mouth daily.     aspirin 81 MG tablet  Take 81 mg by mouth daily.     calcitRIOL 0.25 MCG capsule  Commonly known as:  ROCALTROL  Take 1 capsule (0.25 mcg total) by mouth daily.     cholecalciferol 400 UNITS Tabs tablet  Commonly known as:  VITAMIN D  Take 400 Units by mouth.  donepezil 10 MG tablet  Commonly known as:  ARICEPT  Take 10 mg by mouth at bedtime.     doxazosin 8 MG tablet  Commonly known as:  CARDURA  Take 8 mg by mouth daily.     glucose blood test strip  Commonly known as:  ACCU-CHEK SMARTVIEW  Use as instructed to check blood sugars 2 times per day, dx code 250.00     insulin NPH Human 100 UNIT/ML injection  Commonly known as:  HUMULIN N,NOVOLIN N  Inject 36 Units into the skin 2 (two) times daily.     insulin regular 100 units/mL injection  Commonly known as:  NOVOLIN R,HUMULIN R  Inject 14 Units into the skin 2 (two) times daily before a meal.     tamsulosin 0.4 MG Caps capsule  Commonly known as:  FLOMAX  Take 0.4 mg by mouth.     triamterene-hydrochlorothiazide 75-50 MG per tablet  Commonly known as:  MAXZIDE  Take 1 tablet by mouth daily.        Allergies: No Known Allergies  Past Medical History  Diagnosis Date  . BPH (benign prostatic hyperplasia)   . Bladder cancer      Past Surgical History  Procedure Laterality Date  . Eye surgery      Cataract    Family History  Problem Relation Age of Onset  . Diabetes Sister     Social History:  reports that he has quit smoking. He started smoking about 45 years ago. He has never used smokeless tobacco. His alcohol and drug histories are not on file.   Review of Systems:   Has history of chronic renal dysfunction followed by nephrologist periodically.  recent  GFR is 45  Lipids: He had been on pravastatin 20 mg previously but he is still unclear whether he has this at home. This does not appear on his list However his LDL is below 100     LABS:  Appointment on 05/29/2013  Component Date Value Ref Range Status  . Hemoglobin A1C 05/29/2013 6.6* 4.6 - 6.5 % Final   Glycemic Control Guidelines for People with Diabetes:Non Diabetic:  <6%Goal of Therapy: <7%Additional Action Suggested:  >8%   . Sodium 05/29/2013 140  135 - 145 mEq/L Final  . Potassium 05/29/2013 4.4  3.5 - 5.1 mEq/L Final  . Chloride 05/29/2013 109  96 - 112 mEq/L Final  . CO2 05/29/2013 24  19 - 32 mEq/L Final  . Glucose, Bld 05/29/2013 79  70 - 99 mg/dL Final  . BUN 05/29/2013 21  6 - 23 mg/dL Final  . Creatinine, Ser 05/29/2013 1.8* 0.4 - 1.5 mg/dL Final  . Total Bilirubin 05/29/2013 0.7  0.3 - 1.2 mg/dL Final  . Alkaline Phosphatase 05/29/2013 78  39 - 117 U/L Final  . AST 05/29/2013 22  0 - 37 U/L Final  . ALT 05/29/2013 14  0 - 53 U/L Final  . Total Protein 05/29/2013 6.9  6.0 - 8.3 g/dL Final  . Albumin 05/29/2013 3.7  3.5 - 5.2 g/dL Final  . Calcium 05/29/2013 8.9  8.4 - 10.5 mg/dL Final  . GFR 05/29/2013 44.64* >60.00 mL/min Final  . Cholesterol 05/29/2013 147  0 - 200 mg/dL Final   ATP III Classification       Desirable:  < 200 mg/dL               Borderline High:  200 - 239 mg/dL  High:  > = 240 mg/dL  . Triglycerides 05/29/2013 67.0  0.0 - 149.0 mg/dL Final   Normal:  <150 mg/dLBorderline High:  150 - 199 mg/dL   . HDL 05/29/2013 39.50  >39.00 mg/dL Final  . VLDL 05/29/2013 13.4  0.0 - 40.0 mg/dL Final  . LDL Cholesterol 05/29/2013 94  0 - 99 mg/dL Final  . Total CHOL/HDL Ratio 05/29/2013 4   Final                  Men          Women1/2 Average Risk     3.4          3.3Average Risk          5.0          4.42X Average Risk          9.6          7.13X Average Risk          15.0          11.0                         Diabetic foot exam done in 12/14    Examination:   BP 112/60  Pulse 53  Temp(Src) 98.5 F (36.9 C)  Resp 14  Ht 5\' 6"  (1.676 m)  Wt 192 lb 6.4 oz (87.272 kg)  BMI 31.07 kg/m2  SpO2 98%  Body mass index is 31.07 kg/(m^2).   No pedal edema seen, no skin lesions on soles of feet  ASSESSMENT/ PLAN::    Diabetes type 2:    Blood glucose control  has been good and his recent readings in the mornings are fairly good Discussed timing of glucose monitoring and showed him how to rotate the times of testing throughout the day instead of just in the morning However have asked him to continue using his Accu-Chek monitor  This will be downloaded on the next visit Meanwhile will continue the same insulin regimen, consider reducing the dose if blood sugars are low normal  HYPERTENSION: He will  continue same regimen but have him take only half of the 50 mg Maxzide because of his renal dysfunction  HYPERLIPIDEMIA: LDL is well-controlled compared to previous levels, most likely he is taking his pravastatin   Allean Montfort 06/03/2013, 1:27 PM

## 2013-08-25 ENCOUNTER — Telehealth: Payer: Self-pay | Admitting: *Deleted

## 2013-08-25 NOTE — Telephone Encounter (Signed)
Sent a fax requesting A1C and other labs just following on this please call her if possible she is the case worker

## 2013-08-31 ENCOUNTER — Other Ambulatory Visit (INDEPENDENT_AMBULATORY_CARE_PROVIDER_SITE_OTHER): Payer: 59

## 2013-08-31 DIAGNOSIS — N183 Chronic kidney disease, stage 3 unspecified: Secondary | ICD-10-CM

## 2013-08-31 DIAGNOSIS — E119 Type 2 diabetes mellitus without complications: Secondary | ICD-10-CM

## 2013-08-31 LAB — COMPREHENSIVE METABOLIC PANEL
ALT: 11 U/L (ref 0–53)
AST: 19 U/L (ref 0–37)
Albumin: 3.9 g/dL (ref 3.5–5.2)
Alkaline Phosphatase: 75 U/L (ref 39–117)
BUN: 24 mg/dL — ABNORMAL HIGH (ref 6–23)
CO2: 23 mEq/L (ref 19–32)
CREATININE: 2 mg/dL — AB (ref 0.4–1.5)
Calcium: 8.7 mg/dL (ref 8.4–10.5)
Chloride: 104 mEq/L (ref 96–112)
GFR: 40.75 mL/min — ABNORMAL LOW (ref >60.00)
GLUCOSE: 142 mg/dL — AB (ref 70–99)
Potassium: 4.1 mEq/L (ref 3.5–5.1)
Sodium: 135 mEq/L (ref 135–145)
Total Bilirubin: 0.6 mg/dL (ref 0.2–1.2)
Total Protein: 7.2 g/dL (ref 6.0–8.3)

## 2013-08-31 LAB — CBC
HEMATOCRIT: 37.7 % — AB (ref 39.0–52.0)
Hemoglobin: 12.5 g/dL — ABNORMAL LOW (ref 13.0–17.0)
MCHC: 33.2 g/dL (ref 30.0–36.0)
MCV: 91.4 fl (ref 78.0–100.0)
PLATELETS: 146 10*3/uL — AB (ref 150.0–400.0)
RBC: 4.13 Mil/uL — AB (ref 4.22–5.81)
RDW: 13.8 % (ref 11.5–15.5)
WBC: 6.6 10*3/uL (ref 4.0–10.5)

## 2013-08-31 LAB — HEMOGLOBIN A1C: Hgb A1c MFr Bld: 6.8 % — ABNORMAL HIGH (ref 4.6–6.5)

## 2013-09-01 ENCOUNTER — Telehealth: Payer: Self-pay | Admitting: *Deleted

## 2013-09-01 NOTE — Telephone Encounter (Signed)
No, would worsen kidney function

## 2013-09-01 NOTE — Telephone Encounter (Signed)
Alexis Lopez from [facility] Case management, she wants to know if you think he would benefit from being on an ace inhibitor since his last creatine was 1.8. She wants to know if you would consider that? CB # [phone], ext [phone]

## 2013-09-03 ENCOUNTER — Encounter: Payer: Self-pay | Admitting: Endocrinology

## 2013-09-03 ENCOUNTER — Ambulatory Visit (INDEPENDENT_AMBULATORY_CARE_PROVIDER_SITE_OTHER): Payer: 59 | Admitting: Endocrinology

## 2013-09-03 VITALS — BP 152/64 | HR 59 | Temp 98.0°F | Resp 16 | Ht 66.0 in | Wt 189.8 lb

## 2013-09-03 DIAGNOSIS — N183 Chronic kidney disease, stage 3 unspecified: Secondary | ICD-10-CM

## 2013-09-03 DIAGNOSIS — E119 Type 2 diabetes mellitus without complications: Secondary | ICD-10-CM

## 2013-09-03 DIAGNOSIS — I10 Essential (primary) hypertension: Secondary | ICD-10-CM

## 2013-09-03 NOTE — Patient Instructions (Signed)
Doxazosin 8mg  full tab daily  Please check blood sugars at least half the time about 2 hours after any meal and times per week on waking up. Please bring blood sugar monitor to each visit

## 2013-09-03 NOTE — Progress Notes (Signed)
Patient ID: Alexis Lopez [facility], male   DOB: Aug 21, 1920, 80 y.o.   MRN: [id number]  Reason for Appointment: Diabetes follow-up   History of Present Illness   Diagnosis: Type 2 DIABETES MELITUS, date of diagnosis: 1980  Previous history: He has been on insulin for several years and on the same regimen of NPH and regular insulin This apparently has provided good control of his diabetes with fairly stable doses  A1c in 6/14 was 6.2  Recent history:  His blood sugars appear to be reasonably good at home although higher in the morning recently His record indicates  he is checking in the morning mostly. Again did not use the Accu-Chek that was prescribed Again is not checking any readings after meals A1c is below 7% and probably adequate for his age He has been taking the same regimen of insulin for several years and usually does not like to change the dose, appears to be benefiting from taking the same dose twice a day He does exercise only occasionally, weight is slightly better  Oral hypoglycemic drugs: None   Insulin regimen: 36 NPH 14 regular twice a day, usually right before eating         Proper timing of medications in relation to meals: Yes.          Monitors blood glucose: Once a day.    Glucometer: ? Precision         Blood Glucose readings from home diary: readings before breakfast: 80-173. acs 93-197, not checked recently His blood sugars are recently relatively higher in the morning   Hypoglycemia: None         Meals: 3 meals per day.          Physical activity:  occasionally walking on treadmill           Weight control:  Wt Readings from Last 3 Encounters:  09/03/13 189 lb 12.8 oz (86.093 kg)  06/03/13 192 lb 6.4 oz (87.272 kg)  04/01/13 191 lb 1.6 oz (86.682 kg)        Last eye exam: 03/02/13, apparently no retinopathy  Complications:  nephropathy/renal dysfunction, last microalbumin was 36   Diabetes labs:  Lab Results  Component Value Date   HGBA1C 6.8* 08/31/2013    HGBA1C 6.6* 05/29/2013   HGBA1C 6.4 03/02/2013   Lab Results  Component Value Date   MICROALBUR 14.4* 03/02/2013   LDLCALC 94 05/29/2013   CREATININE 2.0* 08/31/2013     PROBLEM 2: Hypertension.  Currently he is taking amlodipine; Cardura was reduced to half tablet previously to avoid potential orthostasis Blood pressure is higher than usual, previously has been low normal Does not do any monitoring at home but [facility]  Does not have any microalbuminuria     Medication List       This list is accurate as of: 09/03/13  2:43 PM.  Always use your most recent med list.               ACCU-CHEK FASTCLIX LANCETS Misc  Use to check blood sugars 2 times per day     amLODipine 10 MG tablet  Commonly known as:  NORVASC  Take 10 mg by mouth daily.     aspirin 81 MG tablet  Take 81 mg by mouth daily.     brimonidine 0.15 % ophthalmic solution  Commonly known as:  ALPHAGAN     calcitRIOL 0.25 MCG capsule  Commonly known as:  ROCALTROL  Take 1 capsule (0.25 mcg total) by mouth daily.     cholecalciferol 400 UNITS Tabs tablet  Commonly known as:  VITAMIN D  Take 400 Units by mouth.     donepezil 10 MG tablet  Commonly known as:  ARICEPT  Take 10 mg by mouth at bedtime.     doxazosin 8 MG tablet  Commonly known as:  CARDURA  Take 8 mg by mouth daily.     glucose blood test strip  Commonly known as:  ACCU-CHEK SMARTVIEW  Use as instructed to check blood sugars 2 times per day, dx code 250.00     insulin NPH Human 100 UNIT/ML injection  Commonly known as:  HUMULIN N,NOVOLIN N  Inject 36 Units into the skin 2 (two) times daily.     insulin regular 100 units/mL injection  Commonly known as:  NOVOLIN R,HUMULIN R  Inject 14 Units into the skin 2 (two) times daily before a meal.     pravastatin 40 MG tablet  Commonly known as:  PRAVACHOL  Take 40 mg by mouth daily.     tamsulosin 0.4 MG Caps capsule  Commonly  known as:  FLOMAX  Take 0.4 mg by mouth.        Allergies: No Known Allergies  Past Medical History  Diagnosis Date  . BPH (benign prostatic hyperplasia)   . Bladder cancer     Past Surgical History  Procedure Laterality Date  . Eye surgery      Cataract    Family History  Problem Relation Age of Onset  . Diabetes Sister     Social History:  reports that he has quit smoking. He started smoking about 45 years ago. He has never used smokeless tobacco. His alcohol and drug histories are not on file.   Review of Systems:   Has history of chronic renal dysfunction followed by nephrologist periodically.  recent  GFR is 41  Lipids: He says he is taking 2 tablets of pravastatin from the [facility], not clear what dose he is taking  Lab Results  Component Value Date   CHOL 147 05/29/2013   HDL 39.50 05/29/2013   LDLCALC 94 05/29/2013   TRIG 67.0 05/29/2013   CHOLHDL 4 05/29/2013        LABS:  Appointment on 08/31/2013  Component Date Value Ref Range Status  . Hemoglobin A1C 08/31/2013 6.8* 4.6 - 6.5 % Final   Glycemic Control Guidelines for People with Diabetes:Non Diabetic:  <6%Goal of Therapy: <7%Additional Action Suggested:  >8%   . Sodium 08/31/2013 135  135 - 145 mEq/L Final  . Potassium 08/31/2013 4.1  3.5 - 5.1 mEq/L Final  . Chloride 08/31/2013 104  96 - 112 mEq/L Final  . CO2 08/31/2013 23  19 - 32 mEq/L Final  . Glucose, Bld 08/31/2013 142* 70 - 99 mg/dL Final  . BUN 08/31/2013 24* 6 - 23 mg/dL Final  . Creatinine, Ser 08/31/2013 2.0* 0.4 - 1.5 mg/dL Final  . Total Bilirubin 08/31/2013 0.6  0.2 - 1.2 mg/dL Final  . Alkaline Phosphatase 08/31/2013 75  39 - 117 U/L Final  . AST 08/31/2013 19  0 - 37 U/L Final  . ALT 08/31/2013 11  0 - 53 U/L Final  . Total Protein 08/31/2013 7.2  6.0 - 8.3 g/dL Final  . Albumin 08/31/2013 3.9  3.5 - 5.2 g/dL Final  . Calcium 08/31/2013 8.7  8.4 - 10.5 mg/dL Final  . GFR 08/31/2013 40.75* >60.00 mL/min Final  .  WBC  08/31/2013 6.6  4.0 - 10.5 K/uL Final  . RBC 08/31/2013 4.13* 4.22 - 5.81 Mil/uL Final  . Platelets 08/31/2013 146.0* 150.0 - 400.0 K/uL Final  . Hemoglobin 08/31/2013 12.5* 13.0 - 17.0 g/dL Final  . HCT 08/31/2013 37.7* 39.0 - 52.0 % Final  . MCV 08/31/2013 91.4  78.0 - 100.0 fl Final  . MCHC 08/31/2013 33.2  30.0 - 36.0 g/dL Final  . RDW 08/31/2013 13.8  11.5 - 15.5 % Final     Diabetic foot exam done in 12/14    Examination:   BP 152/64  Pulse 59  Temp(Src) 98 F (36.7 C)  Resp 16  Ht 5\' 6"  (1.676 m)  Wt 189 lb 12.8 oz (86.093 kg)  BMI 30.65 kg/m2  SpO2 96%  Body mass index is 30.65 kg/(m^2).   No pedal edema seen   150/74 standing  ASSESSMENT/ PLAN:    Diabetes type 2:    Blood glucose control  has been reasonably good and his recent readings in the mornings are somewhat variable but recently higher However his A1c is below 7% which is adequate Since he is fairly set in his ways as far as checking blood sugars and insulin doses will not make any changes He tends to forget to check his blood sugars in the afternoons and evenings and none after meals despite reminders [facility] Encouraged him to walk more regularly on the treadmill  HYPERTENSION: He will  increase Cardura to 8 mg with blood pressure being high normal considering his age  CKD: Creatinine is about the same. No significant anemia related to his renal dysfunction Calcium is normal with calcitriol, presumably PTH is being checked by a nephrologist  HYPERLIPIDEMIA: LDL is well-controlled as of 3/15   [facility] 09/03/2013, 2:43 PM

## 2013-12-07 ENCOUNTER — Other Ambulatory Visit: Payer: Self-pay | Admitting: *Deleted

## 2013-12-07 ENCOUNTER — Telehealth: Payer: Self-pay | Admitting: Endocrinology

## 2013-12-07 MED ORDER — GLUCOSE BLOOD VI STRP: ORAL_STRIP | Status: DC

## 2013-12-07 NOTE — Telephone Encounter (Signed)
Pt needs rx for test strips accucheck nano smartview please call in soon

## 2013-12-29 ENCOUNTER — Other Ambulatory Visit: Payer: 59

## 2014-01-01 ENCOUNTER — Ambulatory Visit (INDEPENDENT_AMBULATORY_CARE_PROVIDER_SITE_OTHER): Payer: 59 | Admitting: Endocrinology

## 2014-01-01 ENCOUNTER — Encounter: Payer: Self-pay | Admitting: Endocrinology

## 2014-01-01 VITALS — BP 126/62 | HR 60 | Temp 98.0°F | Resp 14 | Ht 66.0 in | Wt 191.4 lb

## 2014-01-01 DIAGNOSIS — I10 Essential (primary) hypertension: Secondary | ICD-10-CM

## 2014-01-01 DIAGNOSIS — E119 Type 2 diabetes mellitus without complications: Secondary | ICD-10-CM

## 2014-01-01 DIAGNOSIS — N183 Chronic kidney disease, stage 3 unspecified: Secondary | ICD-10-CM

## 2014-01-01 DIAGNOSIS — Z23 Encounter for immunization: Secondary | ICD-10-CM

## 2014-01-01 NOTE — Progress Notes (Signed)
Patient ID: Olga Seyler [facility], male   DOB: 1920/11/19, 80 y.o.   MRN: [id number]    Reason for Appointment: Diabetes follow-up   History of Present Illness   Diagnosis: Type 2 DIABETES MELITUS, date of diagnosis: 1980  Previous history: He has been on insulin for several years and on the same regimen of NPH and regular insulin This apparently has provided good control of his diabetes with fairly stable doses  A1c in 6/14 was 6.2  Recent history:  His blood sugars appear to be reasonably good at home although variable in the afternoon His record indicates  he is checking in the morning more often especially recently.  However he has had a couple of relatively low readings recently, once before supper time which was 44; he thinks he had no symptoms at that time Still using the meter that was given by the [facility] Again is not checking any readings after meals and not clear if his dose of regular insulin is adequate A1c has been 7% and his adequate for his age He has been taking the same regimen of insulin for several years   He does exercise only occasionally  And has not been motivated as much to walk on his treadmill  Oral hypoglycemic drugs: None   Insulin regimen: 36 NPH 14 regular twice a day, usually right before eating         Proper timing of medications in relation to meals: Yes.          Monitors blood glucose: Once a day.    Glucometer: ? Precision         Blood Glucose readings from home diary: readings before breakfast: 67-126 Suppertime 59-164, once 206         Meals: 3 meals per day.          Physical activity:  occasionally walking on treadmill           Weight control:  Wt Readings from Last 3 Encounters:  01/01/14 191 lb 6.4 oz (86.818 kg)  09/03/13 189 lb 12.8 oz (86.093 kg)  06/03/13 192 lb 6.4 oz (87.272 kg)        Last eye exam: 03/02/13, apparently no retinopathy  Complications:  nephropathy/renal dysfunction, last microalbumin was 36   Diabetes  labs:  Lab Results  Component Value Date   HGBA1C 6.8* 08/31/2013   HGBA1C 6.6* 05/29/2013   HGBA1C 6.4 03/02/2013   Lab Results  Component Value Date   MICROALBUR 14.4* 03/02/2013   LDLCALC 94 05/29/2013   CREATININE 2.0* 08/31/2013     PROBLEM 2: Hypertension.  Currently he is taking amlodipine; Cardura was reduced to half tablet previously to avoid potential orthostasis Blood pressure is higher than usual, previously has been low normal Does not do any monitoring at home   Does not have any microalbuminuria     Medication List       This list is accurate as of: 01/01/14  1:28 PM.  Always use your most recent med list.               ACCU-CHEK FASTCLIX LANCETS Misc  Use to check blood sugars 2 times per day     amLODipine 10 MG tablet  Commonly known as:  NORVASC  Take 10 mg by mouth daily.     aspirin 81 MG tablet  Take 81 mg by mouth daily.     brimonidine 0.15 % ophthalmic solution  Commonly known as:  ALPHAGAN  calcitRIOL 0.25 MCG capsule  Commonly known as:  ROCALTROL  Take 1 capsule (0.25 mcg total) by mouth daily.     cholecalciferol 400 UNITS Tabs tablet  Commonly known as:  VITAMIN D  Take 400 Units by mouth.     donepezil 10 MG tablet  Commonly known as:  ARICEPT  Take 10 mg by mouth at bedtime.     doxazosin 8 MG tablet  Commonly known as:  CARDURA  Take 8 mg by mouth daily.     glucose blood test strip  Commonly known as:  ACCU-CHEK SMARTVIEW  Use as instructed to check blood sugars 2 times per day, dx code 250.00     insulin NPH Human 100 UNIT/ML injection  Commonly known as:  HUMULIN N,NOVOLIN N  Inject 36 Units into the skin 2 (two) times daily.     insulin regular 100 units/mL injection  Commonly known as:  NOVOLIN R,HUMULIN R  Inject 14 Units into the skin 2 (two) times daily before a meal.     pravastatin 40 MG tablet  Commonly known as:  PRAVACHOL  Take 40 mg by mouth daily.     tamsulosin 0.4 MG Caps capsule  Commonly  known as:  FLOMAX  Take 0.4 mg by mouth.        Allergies: No Known Allergies  Past Medical History  Diagnosis Date  . BPH (benign prostatic hyperplasia)   . Bladder cancer     Past Surgical History  Procedure Laterality Date  . Eye surgery      Cataract    Family History  Problem Relation Age of Onset  . Diabetes Sister     Social History:  reports that he has quit smoking. He started smoking about 45 years ago. He has never used smokeless tobacco. His alcohol and drug histories are not on file.   Review of Systems:   Has history of chronic renal dysfunction followed by nephrologist periodically.  recent  GFR is 41  [facility]: [facility]  Lab Results  Component Value Date   CHOL 147 05/29/2013   HDL 39.50 05/29/2013   LDLCALC 94 05/29/2013   TRIG 67.0 05/29/2013   CHOLHDL 4 05/29/2013      Diabetic foot exam done in 12/14    Examination:   BP 126/62  Pulse 60  Temp(Src) 98 F (36.7 C)  Resp 14  Ht 5\' 6"  (1.676 m)  Wt 191 lb 6.4 oz (86.818 kg)  BMI 30.91 kg/m2  SpO2 98%  Body mass index is 30.91 kg/(m^2).   No pedal edema    BP standing 130/68  ASSESSMENT/ PLAN:    Diabetes type 2:    Blood glucose control  has been reasonably good and his recent readings at home are overall fairly good However his A1c needs to be done today to assess his overall level of control Usually has been getting reasonable control without consistent high or low readings His blood sugars are somewhat variable at suppertime and he thinks this is from variability in his diet  Since he has lower readings occasionally at breakfast and supper will reduce his NPH by 2 units twice a day He was reminded about checking readings after meals but usually does not follow instructions for this Encouraged him to walk more regularly on the treadmill  HYPERTENSION: He will  continue the same medications as blood pressure is very well controlled  CKD:  Creatinine to be checked today  HYPERLIPIDEMIA: LDL  is well-controlled as of 3/15  Influenza vaccine given  Jolana Runkles 01/01/2014, 1:28 PM

## 2014-01-01 NOTE — Patient Instructions (Addendum)
Please check blood sugars at least half the time about 2 hours after any meal and times per week on waking up. Please bring blood sugar diary to each visit  NPH 34 units twice a day

## 2014-01-04 ENCOUNTER — Other Ambulatory Visit (INDEPENDENT_AMBULATORY_CARE_PROVIDER_SITE_OTHER): Payer: 59

## 2014-01-04 DIAGNOSIS — N183 Chronic kidney disease, stage 3 unspecified: Secondary | ICD-10-CM

## 2014-01-04 DIAGNOSIS — E119 Type 2 diabetes mellitus without complications: Secondary | ICD-10-CM

## 2014-01-04 LAB — LIPID PANEL
Cholesterol: 141 mg/dL (ref 0–200)
HDL: 29.4 mg/dL — ABNORMAL LOW (ref >39.00)
LDL Cholesterol: 89 mg/dL (ref 0–99)
NonHDL: 111.6
TRIGLYCERIDES: 115 mg/dL (ref 0.0–149.0)
Total CHOL/HDL Ratio: 5
VLDL: 23 mg/dL (ref 0.0–40.0)

## 2014-01-04 LAB — HEMOGLOBIN A1C: Hgb A1c MFr Bld: 6.2 % (ref 4.6–6.5)

## 2014-01-04 LAB — BASIC METABOLIC PANEL
BUN: 21 mg/dL (ref 6–23)
CO2: 28 mEq/L (ref 19–32)
Calcium: 8.7 mg/dL (ref 8.4–10.5)
Chloride: 105 mEq/L (ref 96–112)
Creatinine, Ser: 1.9 mg/dL — ABNORMAL HIGH (ref 0.4–1.5)
GFR: 43.75 mL/min — AB (ref >60.00)
Glucose, Bld: 156 mg/dL — ABNORMAL HIGH (ref 70–99)
Potassium: 4.5 mEq/L (ref 3.5–5.1)
SODIUM: 138 meq/L (ref 135–145)

## 2014-01-04 LAB — MICROALBUMIN / CREATININE URINE RATIO
Creatinine,U: 72.1 mg/dL
MICROALB/CREAT RATIO: 11.5 mg/g (ref 0.0–30.0)
Microalb, Ur: 8.3 mg/dL — ABNORMAL HIGH (ref 0.0–1.9)

## 2014-01-05 LAB — PARATHYROID HORMONE, INTACT (NO CA): PTH: 25 pg/mL (ref 15–65)

## 2014-05-05 ENCOUNTER — Ambulatory Visit: Payer: 59 | Admitting: Endocrinology

## 2014-05-21 ENCOUNTER — Ambulatory Visit: Payer: Medicare Other | Admitting: Endocrinology

## 2014-05-21 ENCOUNTER — Encounter: Payer: Self-pay | Admitting: *Deleted

## 2014-05-21 ENCOUNTER — Telehealth: Payer: Self-pay | Admitting: Endocrinology

## 2014-05-21 NOTE — Telephone Encounter (Signed)
Letter mailed

## 2014-05-21 NOTE — Telephone Encounter (Signed)
Patient no showed today's appt. Please advise on how to follow up. °A. No follow up necessary. °B. Follow up urgent. Contact patient immediately. °C. Follow up necessary. Contact patient and schedule visit in ___ days. °D. Follow up advised. Contact patient and schedule visit in ____weeks. ° °

## 2014-05-28 ENCOUNTER — Ambulatory Visit (INDEPENDENT_AMBULATORY_CARE_PROVIDER_SITE_OTHER): Payer: 59 | Admitting: Endocrinology

## 2014-05-28 ENCOUNTER — Encounter: Payer: Self-pay | Admitting: Endocrinology

## 2014-05-28 ENCOUNTER — Other Ambulatory Visit: Payer: Self-pay | Admitting: *Deleted

## 2014-05-28 ENCOUNTER — Telehealth: Payer: Self-pay | Admitting: Family Medicine

## 2014-05-28 VITALS — BP 122/52 | HR 61 | Temp 97.7°F | Resp 16 | Ht 66.0 in | Wt 190.6 lb

## 2014-05-28 DIAGNOSIS — E160 Drug-induced hypoglycemia without coma: Secondary | ICD-10-CM

## 2014-05-28 DIAGNOSIS — E785 Hyperlipidemia, unspecified: Secondary | ICD-10-CM | POA: Diagnosis not present

## 2014-05-28 DIAGNOSIS — T383X5A Adverse effect of insulin and oral hypoglycemic [antidiabetic] drugs, initial encounter: Secondary | ICD-10-CM

## 2014-05-28 DIAGNOSIS — I10 Essential (primary) hypertension: Secondary | ICD-10-CM

## 2014-05-28 DIAGNOSIS — E119 Type 2 diabetes mellitus without complications: Secondary | ICD-10-CM

## 2014-05-28 DIAGNOSIS — E1121 Type 2 diabetes mellitus with diabetic nephropathy: Secondary | ICD-10-CM | POA: Diagnosis not present

## 2014-05-28 LAB — LDL CHOLESTEROL, DIRECT: LDL DIRECT: 81 mg/dL

## 2014-05-28 LAB — BASIC METABOLIC PANEL
BUN: 23 mg/dL (ref 6–23)
CO2: 25 meq/L (ref 19–32)
CREATININE: 1.95 mg/dL — AB (ref 0.40–1.50)
Calcium: 9 mg/dL (ref 8.4–10.5)
Chloride: 108 mEq/L (ref 96–112)
GFR: 41.65 mL/min — ABNORMAL LOW (ref >60.00)
GLUCOSE: 43 mg/dL — AB (ref 70–99)
POTASSIUM: 4 meq/L (ref 3.5–5.1)
SODIUM: 137 meq/L (ref 135–145)

## 2014-05-28 LAB — HEMOGLOBIN A1C: HEMOGLOBIN A1C: 6.6 % — AB (ref 4.6–6.5)

## 2014-05-28 MED ORDER — CALCITRIOL 0.25 MCG PO CAPS: 0.2500 ug | ORAL_CAPSULE | Freq: Every day | ORAL | Status: DC

## 2014-05-28 NOTE — Telephone Encounter (Signed)
Call from answering service.  Patient had sugar of 43 on lab draw.  I called pt.  He felt well.  Had eaten already.  Advised him to check his sugar more often over the weekend and keep a snack nearby in case of recurrent low sugars.  He agrees.  Routed to [employer] as FYI.

## 2014-05-28 NOTE — Progress Notes (Signed)
Patient ID: Alexis Lopez Alexis Lopez, male   DOB: 01-18-1921, 81 y.o.   MRN: [id number]    Reason for Appointment: Diabetes follow-up   History of Present Illness   Diagnosis: Type 2 DIABETES MELITUS, date of diagnosis: 1980  Previous history: Alexis Lopez has been on insulin for several years and on the same regimen of NPH and regular insulin This apparently has provided good control of Alexis Lopez diabetes with fairly stable doses  A1c in 6/14 was 6.2  Recent history:  Alexis Lopez blood sugars are difficult to assess as Alexis Lopez is checking Alexis Lopez blood sugar mostly in the mornings and still with Alexis Lopez generic monitor Does not bring Alexis Lopez monitor for download Alexis Lopez takes the same doses of insulin both morning and evening and Alexis Lopez thinks Alexis Lopez is compliant with this. Most likely Alexis Lopez needing the help of Alexis Lopez wife to take care of Alexis Lopez routine  A1c has been under 7% and Alexis Lopez adequate for Alexis Lopez age, however not clear if this is accurate because of Alexis Lopez renal insufficiency Alexis Lopez has been taking the same regimen of insulin for several years   Alexis Lopez does not exercise and not clear if Alexis Lopez diet is well regulated  Oral hypoglycemic drugs: None   Insulin regimen: 36 NPH 14 regular twice a day, usually right before eating         Proper timing of medications in relation to meals: Yes.          Monitors blood glucose: Once a day.    Glucometer: ? Precision         Blood Glucose readings from home diary: readings before breakfast: 67-131, no readings before after supper         Meals: 3 meals per day.          Physical activity:  not motivated to do any         Weight control:  Wt Readings from Last 3 Encounters:  05/28/14 190 lb 9.6 oz (86.456 kg)  01/01/14 191 lb 6.4 oz (86.818 kg)  09/03/13 189 lb 12.8 oz (86.093 kg)        Last eye exam: 03/02/13, apparently no retinopathy  Complications:  nephropathy/renal dysfunction, last microalbumin nearly normal  Diabetes labs:  Lab Results  Component Value Date   HGBA1C 6.2 01/04/2014   HGBA1C 6.8*  08/31/2013   HGBA1C 6.6* 05/29/2013   Lab Results  Component Value Date   MICROALBUR 8.3* 01/04/2014   LDLCALC 89 01/04/2014   CREATININE 1.9* 01/04/2014    PROBLEM 2: Hypertension.  Currently Alexis Lopez is taking amlodipine; this was reduced to half tablet previously Alexis Lopez thinks Alexis Lopez is taking the whole tablet of Cardura although was was told to take only half to avoid potential orthostasis Blood pressure is lower today so she standing up but does not complain of lightheadedness Does not do any monitoring at home       Medication List       This list is accurate as of: 05/28/14  3:33 PM.  Always use your most recent med list.               ACCU-CHEK FASTCLIX LANCETS Misc  Use to check blood sugars 2 times per day     amLODipine 10 MG tablet  Commonly known as:  NORVASC  Take 10 mg by mouth daily.     aspirin 81 MG tablet  Take 81 mg by mouth daily.     brimonidine 0.15 % ophthalmic solution  Commonly known as:  ALPHAGAN  calcitRIOL 0.25 MCG capsule  Commonly known as:  ROCALTROL  Take 1 capsule (0.25 mcg total) by mouth daily.     cholecalciferol 400 UNITS Tabs tablet  Commonly known as:  VITAMIN D  Take 400 Units by mouth.     donepezil 10 MG tablet  Commonly known as:  ARICEPT  Take 10 mg by mouth at bedtime.     doxazosin 8 MG tablet  Commonly known as:  CARDURA  Take 8 mg by mouth daily.     glucose blood test strip  Commonly known as:  ACCU-CHEK SMARTVIEW  Use as instructed to check blood sugars 2 times per day, dx code 250.00     insulin NPH Human 100 UNIT/ML injection  Commonly known as:  HUMULIN N,NOVOLIN N  Inject 36 Units into the skin 2 (two) times daily.     insulin regular 100 units/mL injection  Commonly known as:  NOVOLIN R,HUMULIN R  Inject 14 Units into the skin 2 (two) times daily before a meal.     pravastatin 40 MG tablet  Commonly known as:  PRAVACHOL  Take 40 mg by mouth daily.     tamsulosin 0.4 MG Caps capsule  Commonly known  as:  FLOMAX  Take 0.4 mg by mouth.     timolol 0.5 % ophthalmic solution  Commonly known as:  TIMOPTIC        Allergies: No Known Allergies  Past Medical History  Diagnosis Date  . BPH (benign prostatic hyperplasia)   . Bladder cancer     Past Surgical History  Procedure Laterality Date  . Eye surgery      Cataract    Family History  Problem Relation Age of Onset  . Diabetes Sister     Social History:  reports that Alexis Lopez has quit smoking. Alexis Lopez started smoking about 46 years ago. Alexis Lopez has never used smokeless tobacco. Alexis Lopez alcohol and drug histories are not on file.   Review of Systems:   Has history of chronic renal dysfunction followed by nephrologist periodically.  No records are available  Lipids: [facility]  Lab Results  Component Value Date   CHOL 141 01/04/2014   HDL 29.40* 01/04/2014   LDLCALC 89 01/04/2014   TRIG 115.0 01/04/2014   CHOLHDL 5 01/04/2014     Diabetic foot exam done in  3/16   Examination:   BP 122/52 mmHg  Pulse 61  Temp(Src) 97.7 F (36.5 C)  Resp 16  Ht 5\' 6"  (1.676 m)  Wt 190 lb 9.6 oz (86.456 kg)  BMI 30.78 kg/m2  SpO2 98%  Body mass index is 30.78 kg/(m^2).   No pedal edema    BP standing  114/50  Diabetic foot exam shows decreased monofilament sensation in the toes and plantar surfaces distally, no skin lesions or ulcers on the feet and normal pedal pulses Has significant toenail onychomycosis  ASSESSMENT/ PLAN:    Diabetes type 2:    Blood glucose control  has been reasonably good and Alexis Lopez recent fasting readings at home are overall fairly good However at times Alexis Lopez blood sugar may be low normal and probably overall readings are lower before Alexis Lopez desired level of control at age 4 Again Alexis Lopez is not checking Alexis Lopez blood sugars except in the morning and not clear if Alexis Lopez is having any postprandial change in blood sugar However Alexis Lopez A1c needs to be done today to assess Alexis Lopez overall level of control No  reported hypoglycemia at home  Since Alexis Lopez has lower readings occasionally at breakfast  will reduce Alexis Lopez NPH by 2 units twice a day Alexis Lopez was reminded about checking readings after meals but usually does not follow instructions for this Encouraged him to walk more regularly on the treadmill  Discussed foot care especially with Alexis Lopez mild neuropathy.  Encouraged him to check Alexis Lopez feet regularly and not cut toenails to closely  HYPERTENSION: Alexis Lopez will stop taking the 5 mg amlodipine because of Alexis Lopez blood pressure being excessively low for Alexis Lopez age Alexis Lopez will follow-up with PCP  CKD: Creatinine to be checked today  HYPERLIPIDEMIA: LDL is well-controlled as of 3/15, will get records from PCP of recent labs  Counseling time over 50% of today's 25 minute visit   Yesha Muchow 05/28/2014, 3:33 PM    ADDENDUM: lab glucose was only 43, needs to reduce Alexis Lopez Regular Insulin to 10 units instead of 14 Also needs to be seen back in follow-up in 6 weeks with blood sugars being checked at different times of the day including some before lunch We'll see him in short-term follow-up also for blood pressure readings   Office Visit on 05/28/2014  Component Date Value Ref Range Status  . Hgb A1c MFr Bld 05/28/2014 6.6* 4.6 - 6.5 % Final   Glycemic Control Guidelines for People with Diabetes:Non Diabetic:  <6%Goal of Therapy: <7%Additional Action Suggested:  >8%   . Direct LDL 05/28/2014 81.0   Final   Optimal:  <100 mg/dLNear or Above Optimal:  100-129 mg/dLBorderline High:  130-159 mg/dLHigh:  160-189 mg/dLVery High:  >190 mg/dL  . Sodium 05/28/2014 137  135 - 145 mEq/L Final  . Potassium 05/28/2014 4.0  3.5 - 5.1 mEq/L Final  . Chloride 05/28/2014 108  96 - 112 mEq/L Final  . CO2 05/28/2014 25  19 - 32 mEq/L Final  . Glucose, Bld 05/28/2014 43* 70 - 99 mg/dL Final  . BUN 05/28/2014 23  6 - 23 mg/dL Final  . Creatinine, Ser 05/28/2014 1.95* 0.40 - 1.50 mg/dL Final  . Calcium 05/28/2014 9.0  8.4 - 10.5 mg/dL Final  .  GFR 05/28/2014 41.65* >60.00 mL/min Final

## 2014-05-28 NOTE — Patient Instructions (Signed)
STOP AMLODIPINE   Please check blood sugars at least half the time about 2 hours after any meal and 3 times per week on waking up.  Please bring blood sugar monitor to each visit. Recommended blood sugar levels about 2 hours after meal is 140-180 and on waking up 90-130

## 2014-05-30 NOTE — Progress Notes (Addendum)
Quick Note:  Please let patient know that the lab glucose was only 43, needs to reduce his Regular Insulin to 10 units instead of 14 Also needs to be seen back in follow-up in 6 weeks with blood sugars being checked at different times of the day including some before lunch please relay this to his wife

## 2014-06-09 ENCOUNTER — Ambulatory Visit (INDEPENDENT_AMBULATORY_CARE_PROVIDER_SITE_OTHER): Payer: 59 | Admitting: Podiatry

## 2014-06-09 ENCOUNTER — Encounter: Payer: Self-pay | Admitting: Podiatry

## 2014-06-09 VITALS — BP 135/78 | HR 60

## 2014-06-09 DIAGNOSIS — B351 Tinea unguium: Secondary | ICD-10-CM | POA: Diagnosis not present

## 2014-06-09 DIAGNOSIS — M79676 Pain in unspecified toe(s): Secondary | ICD-10-CM

## 2014-06-09 NOTE — Progress Notes (Signed)
   Subjective:    Patient ID: Alexis Lopez, male    DOB: 1920-12-22, 81 y.o.   MRN: [id number]  HPI 81 year old male presents the office they with complex painful, thick elongated toenails which she is unable to trim himself. Denies any redness or drainage on the nail sites. No other complaints at this time.   Review of Systems  All other systems reviewed and are negative.      Objective:   Physical Exam Awake, alert, NAD DP/PT pulses palpable, CRT less than 3 seconds Protective sensation decreased with Simms Weinstein monofilament, decreased vibratory sensation, Achilles tendon reflex intact. Nails are hypertrophic, dystrophic, elongated, brittle, discolored 10. There is no surrounding erythema or drainage on the nail sites. There is subjective tenderness on nails 1-5 bilaterally No open lesions or pre-ulcer lesions identified bilaterally. No other areas of tenderness to bilateral lower extremities. No pain with calf compression, swelling, warmth, erythema.       Assessment & Plan:  81 year old male with symptomatic onychomycosis -Treatment options were discussed with the patient include alternatives, risks, complications. -Nail sharply debrided 10 without complication/bleeding. -They were inquiring about possible treatment options for nail fungus. I discussed over-the-counter fungi nail. -Discussed importance daily foot inspection. -Follow-up in 3 months or sooner if any problems are to arise. In the meantime occurs call the office with any questions, concerns, change in symptoms.

## 2014-07-15 ENCOUNTER — Encounter: Payer: Self-pay | Admitting: Endocrinology

## 2014-07-15 ENCOUNTER — Ambulatory Visit (INDEPENDENT_AMBULATORY_CARE_PROVIDER_SITE_OTHER): Payer: 59 | Admitting: Endocrinology

## 2014-07-15 VITALS — BP 138/64 | HR 60 | Temp 98.0°F | Resp 14 | Ht 66.0 in | Wt 184.4 lb

## 2014-07-15 DIAGNOSIS — E160 Drug-induced hypoglycemia without coma: Secondary | ICD-10-CM

## 2014-07-15 DIAGNOSIS — E119 Type 2 diabetes mellitus without complications: Secondary | ICD-10-CM

## 2014-07-15 DIAGNOSIS — T383X5A Adverse effect of insulin and oral hypoglycemic [antidiabetic] drugs, initial encounter: Secondary | ICD-10-CM | POA: Diagnosis not present

## 2014-07-15 NOTE — Patient Instructions (Addendum)
Reduce INSULIN TO 30 units N and 8 Regular twice daily  Do have a mid afternoon snack  Please check blood sugars at least half the time about 2 hours after any meal and 3 times per week on waking up.  Please bring blood sugar monitor to each visit. Recommended blood sugar levels about 2 hours after meal is 140-180 and on waking up 90-130

## 2014-07-15 NOTE — Progress Notes (Signed)
Patient ID: Alexis Lopez [facility], male   DOB: [birth year], 81 y.o.   MRN: [id number]    Reason for Appointment:  follow-up of hypoglycemia  History of Present Illness   Diagnosis: Type 2 DIABETES MELITUS, date of diagnosis: 1980  Previous history: He has been on insulin for several years and on the same regimen of NPH and regular insulin This apparently has provided good control of his diabetes with fairly stable doses  A1c in 6/14 was 6.2  Recent history:  His blood sugar got low when he was on a trip in [place] last Saturday. He apparently had cereal for breakfast and was somewhat late for lunch and his blood sugar got down to 23.  He was having difficulty standing up and little confused and was treated by EMS. He now says that he gets up very late in the morning and may not have his first meal before noon.  Also does not usually have lunch He appears to have lost some weight and he does not think he is eating less His wife is working some and not able to watch him His daughter is present today and is concerned about his accuracy of drawing up insulin since his vision is somewhat impaired.  He has been using syringes for his insulin all along. He does not exercise but new may have been a little more active when he was on his trip except on the day he had his low sugar His Regular Insulin was reduced by 4 units on his last visit because of relatively asymptomatic low glucose of 43 in the lab A1c has been under 7% and his adequate for his age, however not clear if this is accurate because of his renal insufficiency He is usually compliant with taking his insulin before eating twice a day  Recently blood sugars have been fairly good although checking blood sugars mostly in the morning and no hypoglycemia documented at home  Oral hypoglycemic drugs: None   Insulin regimen: 34 NPH 10 regular twice a day, usually right before eating         Proper timing of medications in relation to meals: Yes.           Monitors blood glucose: Once a day.    Glucometer: Accu-Chek  Blood Glucose readings from   PRE-MEAL Breakfast Lunch Dinner Bedtime Overall  Glucose range:  77-136   86   82-129   147, 211    Mean/median:      126            Meals: 3 meals per day. breakfast 11 AM-12 noon.   Dinner 6 pm      He usually eat cereal in the morning and sometimes has walnuts but no other protein     Physical activity:  not motivated to do any         Weight control:  Wt Readings from Last 3 Encounters:  07/15/14 184 lb 6.4 oz (83.643 kg)  05/28/14 190 lb 9.6 oz (86.456 kg)  01/01/14 191 lb 6.4 oz (86.818 kg)        Last eye exam: 03/02/13, apparently no retinopathy  Complications:  nephropathy/renal dysfunction, last microalbumin nearly normal  Diabetes labs:  Lab Results  Component Value Date   HGBA1C 6.6* 05/28/2014   HGBA1C 6.2 01/04/2014   HGBA1C 6.8* 08/31/2013   Lab Results  Component Value Date   MICROALBUR 8.3* 01/04/2014   LDLCALC 89 01/04/2014   CREATININE 1.95* 05/28/2014  Medication List       This list is accurate as of: 07/15/14  8:57 AM.  Always use your most recent med list.               ACCU-CHEK FASTCLIX LANCETS Misc  Use to check blood sugars 2 times per day     amLODipine 10 MG tablet  Commonly known as:  NORVASC  Take 10 mg by mouth daily.     aspirin 81 MG tablet  Take 81 mg by mouth daily.     brimonidine 0.15 % ophthalmic solution  Commonly known as:  ALPHAGAN     calcitRIOL 0.25 MCG capsule  Commonly known as:  ROCALTROL  Take 1 capsule (0.25 mcg total) by mouth daily.     cholecalciferol 400 UNITS Tabs tablet  Commonly known as:  VITAMIN D  Take 400 Units by mouth.     donepezil 10 MG tablet  Commonly known as:  ARICEPT  Take 10 mg by mouth at bedtime.     doxazosin 8 MG tablet  Commonly known as:  CARDURA  Take 8 mg by mouth daily.     glucose blood test strip  Commonly known as:  ACCU-CHEK SMARTVIEW  Use as  instructed to check blood sugars 2 times per day, dx code 250.00     insulin NPH Human 100 UNIT/ML injection  Commonly known as:  HUMULIN N,NOVOLIN N  Inject 36 Units into the skin 2 (two) times daily.     insulin regular 100 units/mL injection  Commonly known as:  NOVOLIN R,HUMULIN R  Inject 14 Units into the skin 2 (two) times daily before a meal.     pravastatin 40 MG tablet  Commonly known as:  PRAVACHOL  Take 40 mg by mouth daily.     tamsulosin 0.4 MG Caps capsule  Commonly known as:  FLOMAX  Take 0.4 mg by mouth.     timolol 0.5 % ophthalmic solution  Commonly known as:  TIMOPTIC        Allergies: No Known Allergies  Past Medical History  Diagnosis Date  . BPH (benign prostatic hyperplasia)   . Bladder cancer   . Diabetes mellitus without complication     Past Surgical History  Procedure Laterality Date  . Eye surgery      Cataract    Family History  Problem Relation Age of Onset  . Diabetes Sister     Social History:  reports that he has quit smoking. He started smoking about 46 years ago. He has never used smokeless tobacco. He reports that he does not drink alcohol or use illicit drugs.   Review of Systems:   Has history of chronic renal dysfunction followed by nephrologist periodically.  No records are available  Lipids: [facility]  Lab Results  Component Value Date   CHOL 141 01/04/2014   HDL 29.40* 01/04/2014   LDLCALC 89 01/04/2014   LDLDIRECT 81.0 05/28/2014   TRIG 115.0 01/04/2014   CHOLHDL 5 01/04/2014     Diabetic foot exam done in  3/16   Examination:   BP 138/64 mmHg  Pulse 60  Temp(Src) 98 F (36.7 C)  Resp 14  Ht 5\' 6"  (1.676 m)  Wt 184 lb 6.4 oz (83.643 kg)  BMI 29.78 kg/m2  SpO2 97%  Body mass index is 29.78 kg/(m^2).   No pedal edema     ASSESSMENT/ PLAN:    Diabetes type 2:    He  has had an episode of hypoglycemia in the early afternoon probably related to his changed  schedule when he was on his trip and not getting enough protein in the morning as well as somewhat delayed lunch. Although his monitoring at home does not reveal any hypoglycemia is checking blood sugars primarily in the morning but generally not later in the day.  On his last visit his glucose was 43 and not clear if he was having symptoms at that time He does have some issues with memory and not clear if he has some issues with visual acuity His overall management was reviewed in detail today He needs to make the following changes:  Add protein to the morning meal  Start having a mid afternoon snack  Reduce insulin doses because of his age and need for less tight of control  Trial off an insulin pen to see if he can do more accurate measurement of his insulin.  This was demonstrated to him in detail and also a sample of Humulin N given for him to try.  His daughter will help him learn this also  More consistent monitoring at various times and discussed rotating the times to some readings after all the meals   Insulin doses will be 30 NPH and 8 regular twice a day Follow-up in 6 weeks  Counseling time over 50% of today's 25 minute visit   Olimpia Tinch 07/15/2014, 8:57 AM

## 2014-08-24 ENCOUNTER — Other Ambulatory Visit: Payer: Self-pay | Admitting: *Deleted

## 2014-08-24 ENCOUNTER — Other Ambulatory Visit (INDEPENDENT_AMBULATORY_CARE_PROVIDER_SITE_OTHER): Payer: 59

## 2014-08-24 DIAGNOSIS — E785 Hyperlipidemia, unspecified: Secondary | ICD-10-CM

## 2014-08-24 DIAGNOSIS — E119 Type 2 diabetes mellitus without complications: Secondary | ICD-10-CM

## 2014-08-24 LAB — LIPID PANEL
Cholesterol: 150 mg/dL (ref 0–200)
HDL: 31.6 mg/dL — ABNORMAL LOW
LDL Cholesterol: 91 mg/dL (ref 0–99)
NonHDL: 118.4
Total CHOL/HDL Ratio: 5
Triglycerides: 139 mg/dL (ref 0.0–149.0)
VLDL: 27.8 mg/dL (ref 0.0–40.0)

## 2014-08-24 LAB — BASIC METABOLIC PANEL
BUN: 28 mg/dL — AB (ref 6–23)
CO2: 21 mEq/L (ref 19–32)
Calcium: 9.1 mg/dL (ref 8.4–10.5)
Chloride: 108 mEq/L (ref 96–112)
Creatinine, Ser: 2.02 mg/dL — ABNORMAL HIGH (ref 0.40–1.50)
GFR: 39.97 mL/min — ABNORMAL LOW (ref >60.00)
Glucose, Bld: 106 mg/dL — ABNORMAL HIGH (ref 70–99)
POTASSIUM: 4 meq/L (ref 3.5–5.1)
Sodium: 137 mEq/L (ref 135–145)

## 2014-08-24 LAB — HEMOGLOBIN A1C: Hgb A1c MFr Bld: 6.3 % (ref 4.6–6.5)

## 2014-08-27 ENCOUNTER — Encounter: Payer: Self-pay | Admitting: Endocrinology

## 2014-08-27 ENCOUNTER — Ambulatory Visit (INDEPENDENT_AMBULATORY_CARE_PROVIDER_SITE_OTHER): Payer: 59 | Admitting: Endocrinology

## 2014-08-27 VITALS — BP 122/50 | HR 48 | Temp 98.0°F | Resp 14 | Ht 66.0 in | Wt 181.8 lb

## 2014-08-27 DIAGNOSIS — R001 Bradycardia, unspecified: Secondary | ICD-10-CM

## 2014-08-27 DIAGNOSIS — IMO0002 Reserved for concepts with insufficient information to code with codable children: Secondary | ICD-10-CM

## 2014-08-27 DIAGNOSIS — E1165 Type 2 diabetes mellitus with hyperglycemia: Secondary | ICD-10-CM

## 2014-08-27 NOTE — Patient Instructions (Addendum)
30 NPH in am and 26 N at night   Stay on 8 regular twice a day  Total 38 in am and 34 in pm  Check blood sugars on waking up ..  .. times a week Also check blood sugars about 2 hours after a meal and do this after different meals by rotation Recommended blood sugar levels on waking up is 90-130 and about 2 hours after meal is 140-180 Please bring blood sugar monitor to each visit.  Reduce Amlodipine to 1/2

## 2014-08-27 NOTE — Progress Notes (Signed)
Patient ID: Alexis Lopez [facility], male   DOB: [birth year], 81 y.o.   MRN: [id number]    Reason for Appointment:  follow-up of hypoglycemia  History of Present Illness   Diagnosis: Type 2 DIABETES MELITUS, date of diagnosis: 1980  Previous history: He has been on insulin for several years and on the same regimen of NPH and regular insulin This apparently has provided good control of his diabetes with fairly stable doses  A1c in 6/14 was 6.2  Recent history:    He is here for follow-up after his visit recently for episode of severe hypoglycemia when he was traveling and glucose was 23  His insulin dose was reduced NPH and regular Although he was advised to try and use an insulin pen for more accuracy did not do this and prefers to mix the 2 insulins together and sensory  Current blood sugar patterns and problems identified:  His blood sugars are still relatively low about 3-4 weeks ago including an episode of early morning hypoglycemia but they are not as low now  His fasting blood sugars continue to be near normal although last done there are frequently below 100  He has done better with checking his blood sugars in the evenings although probably checking more readings before eating rather than after  Blood sugars are sporadically higher after her evening meal but not consistently  Overall blood sugar is averaging 123 recently  He is fairly consistent with taking his insulin right before eating and he can remember to do this better than 30 minutes  A1c has been under 7% and his adequate for his age, however not clear if this is accurate because of his renal insufficiency  Oral hypoglycemic drugs: None   Insulin regimen:30 NPH and 8 regular twice a day Proper timing of medications in relation to meals: Yes.          Monitors blood glucose: Once a day.    Glucometer: Accu-Chek  Blood Glucose readings from   Mean values apply above for all meters except median for One  Touch  PRE-MEAL Fasting Lunch Dinner Bedtime Overall  Glucose range:  41-144   69-140   126-162   106-266    Mean/median:  100     133  120          Meals: 3 meals per day. breakfast 11 AM-12 noon.   Dinner 6 pm      He usually eat cereal in the morning and sometimes has walnuts but no other protein     Physical activity:  not motivated to do any         Weight control:  Wt Readings from Last 3 Encounters:  08/27/14 181 lb 12.8 oz (82.464 kg)  07/15/14 184 lb 6.4 oz (83.643 kg)  05/28/14 190 lb 9.6 oz (38.101 kg)     Complications:  nephropathy/renal dysfunction, last microalbumin nearly normal  Diabetes labs:  Lab Results  Component Value Date   HGBA1C 6.3 08/24/2014   HGBA1C 6.6* 05/28/2014   HGBA1C 6.2 01/04/2014   Lab Results  Component Value Date   MICROALBUR 8.3* 01/04/2014   LDLCALC 91 08/24/2014   CREATININE 2.02* 08/24/2014         Medication List       This list is accurate as of: 08/27/14  3:04 PM.  Always use your most recent med list.               ACCU-CHEK FASTCLIX LANCETS Misc  Use to  check blood sugars 2 times per day     amLODipine 10 MG tablet  Commonly known as:  NORVASC  Take 10 mg by mouth daily.     aspirin 81 MG tablet  Take 81 mg by mouth daily.     brimonidine 0.15 % ophthalmic solution  Commonly known as:  ALPHAGAN     calcitRIOL 0.25 MCG capsule  Commonly known as:  ROCALTROL  Take 1 capsule (0.25 mcg total) by mouth daily.     cholecalciferol 400 UNITS Tabs tablet  Commonly known as:  VITAMIN D  Take 400 Units by mouth.     donepezil 10 MG tablet  Commonly known as:  ARICEPT  Take 10 mg by mouth at bedtime.     doxazosin 8 MG tablet  Commonly known as:  CARDURA  Take 8 mg by mouth daily.     glucose blood test strip  Commonly known as:  ACCU-CHEK SMARTVIEW  Use as instructed to check blood sugars 2 times per day, dx code 250.00     insulin NPH Human 100 UNIT/ML injection  Commonly known as:  HUMULIN N,NOVOLIN  N  Inject 30 Units into the skin 2 (two) times daily.     insulin regular 100 units/mL injection  Commonly known as:  NOVOLIN R,HUMULIN R  Inject 8 Units into the skin 2 (two) times daily before a meal.     pravastatin 40 MG tablet  Commonly known as:  PRAVACHOL  Take 40 mg by mouth daily.     tamsulosin 0.4 MG Caps capsule  Commonly known as:  FLOMAX  Take 0.4 mg by mouth.     timolol 0.5 % ophthalmic solution  Commonly known as:  TIMOPTIC        Allergies: No Known Allergies  Past Medical History  Diagnosis Date  . BPH (benign prostatic hyperplasia)   . Bladder cancer   . Diabetes mellitus without complication     Past Surgical History  Procedure Laterality Date  . Eye surgery      Cataract    Family History  Problem Relation Age of Onset  . Diabetes Sister     Social History:  reports that he has quit smoking. He started smoking about 46 years ago. He has never used smokeless tobacco. He reports that he does not drink alcohol or use illicit drugs.   Review of Systems:   Has history of chronic renal dysfunction followed by nephrologist periodically.  No records are available from nephrologist Creatinine is stable around 2  He follows up with his PCP for hypertension, taking amlodipine and doxazosin  Lipids: [facility]  Lab Results  Component Value Date   CHOL 150 08/24/2014   HDL 31.60* 08/24/2014   LDLCALC 91 08/24/2014   LDLDIRECT 81.0 05/28/2014   TRIG 139.0 08/24/2014   CHOLHDL 5 08/24/2014     Diabetic foot exam done in  3/16   Examination:   BP 122/50 mmHg  Pulse 48  Temp(Src) 98 F (36.7 C)  Resp 14  Ht 5\' 6"  (1.676 m)  Wt 181 lb 12.8 oz (82.464 kg)  BMI 29.36 kg/m2  SpO2 97%  Body mass index is 29.36 kg/(m^2).   No pedal edema    Heart rate about 50, occasionally irregular.  No abnormal heart sounds  ASSESSMENT/ PLAN:    Diabetes type 2:    He has had reduced requirement for his insulin  more recently even though his renal function is  not significantly worse Since he does not need aggressive control will need to engineer to reduce his insulin if needed  His lower sugars overall are in the mornings and he had an episode of hypoglycemia last month around 4 AM also He thinks he would want to continue using the insulin syringe instead of the pen because he can mix the 2 insulins together Although ideally should be taking his NPH at bedtime and evening instead of suppertime but he wants to continue mixing them in the evening at suppertime  Insulin doses will be 30 NPH and 8 regular in the morning and 26 NPH and 8 regular in the evening   Advised him to call if he has any consistently high or low readings  BRADYCARDIA and hypertension: He needs to follow-up with his PCP.  However since his blood pressure is relatively low we'll reduce his Norvasc to 5 mg in the meantime   [facility] 08/27/2014, 3:04 PM   Note: This office note was prepared with Estate agent. Any transcriptional errors that result from this process are unintentional.

## 2014-09-08 ENCOUNTER — Ambulatory Visit: Payer: Medicare Other | Admitting: Podiatry

## 2014-10-01 ENCOUNTER — Ambulatory Visit (INDEPENDENT_AMBULATORY_CARE_PROVIDER_SITE_OTHER): Payer: 59 | Admitting: Podiatry

## 2014-10-01 DIAGNOSIS — E1165 Type 2 diabetes mellitus with hyperglycemia: Secondary | ICD-10-CM | POA: Diagnosis not present

## 2014-10-01 DIAGNOSIS — M79676 Pain in unspecified toe(s): Secondary | ICD-10-CM | POA: Diagnosis not present

## 2014-10-01 DIAGNOSIS — B351 Tinea unguium: Secondary | ICD-10-CM

## 2014-10-01 NOTE — Progress Notes (Signed)
   Subjective:    Patient ID: Alexis Lopez, male    DOB: Aug 28, 1920, 81 y.o.   MRN: [id number]  HPI 81 year old male presents the office they with complex painful, thick elongated toenails which she is unable to trim himself. Denies any redness or drainage on the nail sites. No other complaints at this time.   Review of Systems  All other systems reviewed and are negative.      Objective:   Physical Exam Awake, alert, NAD DP/PT pulses palpable, CRT less than 3 seconds Protective sensation decreased with Simms Weinstein monofilament, decreased vibratory sensation, Achilles tendon reflex intact. Nails are hypertrophic, dystrophic, elongated, brittle, discolored 10. There is no surrounding erythema or drainage on the nail sites. There is subjective tenderness on nails 1-5 bilaterally No open lesions or pre-ulcer lesions identified bilaterally. No other areas of tenderness to bilateral lower extremities. No pain with calf compression, swelling, warmth, erythema.       Assessment & Plan:  81 year old male with symptomatic onychomycosis -Treatment options were discussed with the patient include alternatives, risks, complications. -Nail sharply debrided 10 without complication/bleeding. -They were inquiring about possible treatment options for nail fungus. I discussed over-the-counter fungi nail. -Discussed importance daily foot inspection. -Follow-up in 3 months or sooner if any problems are to arise. In the meantime occurs call the office with any questions, concerns, change in symptoms.  Celesta Gentile, DPM

## 2014-10-26 ENCOUNTER — Encounter (HOSPITAL_COMMUNITY): Payer: Self-pay | Admitting: Physical Medicine and Rehabilitation

## 2014-10-26 ENCOUNTER — Inpatient Hospital Stay (HOSPITAL_COMMUNITY)
Admission: EM | Admit: 2014-10-26 | Discharge: 2014-10-29 | DRG: 093 | Disposition: A | Discharge status: Skilled Nursing Facility | Payer: 59 | Attending: Internal Medicine | Admitting: Internal Medicine

## 2014-10-26 ENCOUNTER — Emergency Department (HOSPITAL_COMMUNITY): Payer: 59

## 2014-10-26 ENCOUNTER — Observation Stay (HOSPITAL_COMMUNITY): Payer: 59

## 2014-10-26 DIAGNOSIS — N400 Enlarged prostate without lower urinary tract symptoms: Secondary | ICD-10-CM | POA: Diagnosis present

## 2014-10-26 DIAGNOSIS — R001 Bradycardia, unspecified: Secondary | ICD-10-CM | POA: Diagnosis present

## 2014-10-26 DIAGNOSIS — I10 Essential (primary) hypertension: Secondary | ICD-10-CM | POA: Diagnosis present

## 2014-10-26 DIAGNOSIS — N183 Chronic kidney disease, stage 3 unspecified: Secondary | ICD-10-CM | POA: Diagnosis present

## 2014-10-26 DIAGNOSIS — M5126 Other intervertebral disc displacement, lumbar region: Secondary | ICD-10-CM | POA: Diagnosis present

## 2014-10-26 DIAGNOSIS — Z7982 Long term (current) use of aspirin: Secondary | ICD-10-CM

## 2014-10-26 DIAGNOSIS — Z79899 Other long term (current) drug therapy: Secondary | ICD-10-CM

## 2014-10-26 DIAGNOSIS — R262 Difficulty in walking, not elsewhere classified: Secondary | ICD-10-CM | POA: Diagnosis present

## 2014-10-26 DIAGNOSIS — I129 Hypertensive chronic kidney disease with stage 1 through stage 4 chronic kidney disease, or unspecified chronic kidney disease: Secondary | ICD-10-CM | POA: Diagnosis present

## 2014-10-26 DIAGNOSIS — D696 Thrombocytopenia, unspecified: Secondary | ICD-10-CM | POA: Diagnosis present

## 2014-10-26 DIAGNOSIS — E1122 Type 2 diabetes mellitus with diabetic chronic kidney disease: Secondary | ICD-10-CM | POA: Diagnosis present

## 2014-10-26 DIAGNOSIS — W19XXXA Unspecified fall, initial encounter: Secondary | ICD-10-CM | POA: Diagnosis present

## 2014-10-26 DIAGNOSIS — F0390 Unspecified dementia without behavioral disturbance: Secondary | ICD-10-CM | POA: Diagnosis present

## 2014-10-26 DIAGNOSIS — I714 Abdominal aortic aneurysm, without rupture: Secondary | ICD-10-CM | POA: Diagnosis present

## 2014-10-26 DIAGNOSIS — E119 Type 2 diabetes mellitus without complications: Secondary | ICD-10-CM | POA: Diagnosis not present

## 2014-10-26 DIAGNOSIS — M545 Low back pain, unspecified: Secondary | ICD-10-CM | POA: Diagnosis present

## 2014-10-26 DIAGNOSIS — R531 Weakness: Secondary | ICD-10-CM

## 2014-10-26 DIAGNOSIS — Z87891 Personal history of nicotine dependence: Secondary | ICD-10-CM

## 2014-10-26 DIAGNOSIS — Z794 Long term (current) use of insulin: Secondary | ICD-10-CM

## 2014-10-26 DIAGNOSIS — Z8551 Personal history of malignant neoplasm of bladder: Secondary | ICD-10-CM

## 2014-10-26 DIAGNOSIS — G9520 Unspecified cord compression: Secondary | ICD-10-CM | POA: Diagnosis not present

## 2014-10-26 DIAGNOSIS — M4804 Spinal stenosis, thoracic region: Secondary | ICD-10-CM | POA: Diagnosis present

## 2014-10-26 DIAGNOSIS — D649 Anemia, unspecified: Secondary | ICD-10-CM | POA: Diagnosis present

## 2014-10-26 DIAGNOSIS — M4806 Spinal stenosis, lumbar region: Secondary | ICD-10-CM | POA: Diagnosis present

## 2014-10-26 HISTORY — DX: Chronic kidney disease, unspecified: N18.9

## 2014-10-26 HISTORY — DX: Essential (primary) hypertension: I10

## 2014-10-26 LAB — CBG MONITORING, ED: Glucose-Capillary: 177 mg/dL — ABNORMAL HIGH (ref 65–99)

## 2014-10-26 LAB — CBC WITH DIFFERENTIAL/PLATELET
Basophils Absolute: 0 10*3/uL (ref 0.0–0.1)
Basophils Relative: 0 % (ref 0–1)
EOS ABS: 0.1 10*3/uL (ref 0.0–0.7)
Eosinophils Relative: 2 % (ref 0–5)
HEMATOCRIT: 38.2 % — AB (ref 39.0–52.0)
Hemoglobin: 13 g/dL (ref 13.0–17.0)
LYMPHS ABS: 1.4 10*3/uL (ref 0.7–4.0)
Lymphocytes Relative: 27 % (ref 12–46)
MCH: 30.3 pg (ref 26.0–34.0)
MCHC: 34 g/dL (ref 30.0–36.0)
MCV: 89 fL (ref 78.0–100.0)
MONOS PCT: 12 % (ref 3–12)
Monocytes Absolute: 0.6 10*3/uL (ref 0.1–1.0)
Neutro Abs: 3 10*3/uL (ref 1.7–7.7)
Neutrophils Relative %: 59 % (ref 43–77)
Platelets: 124 10*3/uL — ABNORMAL LOW (ref 150–400)
RBC: 4.29 MIL/uL (ref 4.22–5.81)
RDW: 13.3 % (ref 11.5–15.5)
WBC: 5.1 10*3/uL (ref 4.0–10.5)

## 2014-10-26 LAB — COMPREHENSIVE METABOLIC PANEL
ALBUMIN: 3.6 g/dL (ref 3.5–5.0)
ALT: 14 U/L — ABNORMAL LOW (ref 17–63)
ANION GAP: 9 (ref 5–15)
AST: 29 U/L (ref 15–41)
Alkaline Phosphatase: 75 U/L (ref 38–126)
BUN: 20 mg/dL (ref 6–20)
CHLORIDE: 109 mmol/L (ref 101–111)
CO2: 20 mmol/L — ABNORMAL LOW (ref 22–32)
Calcium: 8.9 mg/dL (ref 8.9–10.3)
Creatinine, Ser: 1.88 mg/dL — ABNORMAL HIGH (ref 0.61–1.24)
GFR calc Af Amer: 34 mL/min — ABNORMAL LOW (ref >60)
GFR calc non Af Amer: 30 mL/min — ABNORMAL LOW (ref >60)
Glucose, Bld: 150 mg/dL — ABNORMAL HIGH (ref 65–99)
Potassium: 4.7 mmol/L (ref 3.5–5.1)
Sodium: 138 mmol/L (ref 135–145)
Total Bilirubin: 0.8 mg/dL (ref 0.3–1.2)
Total Protein: 6.8 g/dL (ref 6.5–8.1)

## 2014-10-26 LAB — URINE MICROSCOPIC-ADD ON

## 2014-10-26 LAB — URINALYSIS, ROUTINE W REFLEX MICROSCOPIC
Bilirubin Urine: NEGATIVE
GLUCOSE, UA: NEGATIVE mg/dL
Ketones, ur: NEGATIVE mg/dL
Leukocytes, UA: NEGATIVE
Nitrite: NEGATIVE
PH: 5 (ref 5.0–8.0)
Protein, ur: 30 mg/dL — AB
SPECIFIC GRAVITY, URINE: 1.019 (ref 1.005–1.030)
Urobilinogen, UA: 0.2 mg/dL (ref 0.0–1.0)

## 2014-10-26 LAB — I-STAT TROPONIN, ED: Troponin i, poc: 0.06 ng/mL (ref 0.00–0.08)

## 2014-10-26 MED ORDER — ONDANSETRON HCL 4 MG/2ML IJ SOLN: 4.0000 mg | Freq: Four times a day (QID) | INTRAMUSCULAR | Status: DC | PRN

## 2014-10-26 MED ORDER — TIMOLOL MALEATE 0.5 % OP SOLN
1.0000 [drp] | Freq: Two times a day (BID) | OPHTHALMIC | Status: DC
  Administered 2014-10-27 – 2014-10-29 (×5): 1 [drp] via OPHTHALMIC
  Filled 2014-10-26: qty 5

## 2014-10-26 MED ORDER — DOXAZOSIN MESYLATE 8 MG PO TABS
8.0000 mg | ORAL_TABLET | Freq: Every day | ORAL | Status: DC
  Administered 2014-10-27 – 2014-10-29 (×3): 8 mg via ORAL
  Filled 2014-10-26 (×3): qty 1

## 2014-10-26 MED ORDER — SODIUM CHLORIDE 0.9 % IJ SOLN
3.0000 mL | Freq: Two times a day (BID) | INTRAMUSCULAR | Status: DC
  Administered 2014-10-27 – 2014-10-29 (×5): 3 mL via INTRAVENOUS

## 2014-10-26 MED ORDER — PRAVASTATIN SODIUM 40 MG PO TABS
40.0000 mg | ORAL_TABLET | Freq: Every day | ORAL | Status: DC
  Administered 2014-10-27 (×2): 40 mg via ORAL
  Filled 2014-10-26 (×2): qty 1

## 2014-10-26 MED ORDER — INSULIN NPH (HUMAN) (ISOPHANE) 100 UNIT/ML ~~LOC~~ SUSP
30.0000 [IU] | Freq: Every day | SUBCUTANEOUS | Status: DC
  Administered 2014-10-27 – 2014-10-28 (×2): 30 [IU] via SUBCUTANEOUS
  Filled 2014-10-26: qty 10

## 2014-10-26 MED ORDER — ACETAMINOPHEN 325 MG PO TABS: 650.0000 mg | ORAL_TABLET | Freq: Four times a day (QID) | ORAL | Status: DC | PRN

## 2014-10-26 MED ORDER — TAMSULOSIN HCL 0.4 MG PO CAPS
0.4000 mg | ORAL_CAPSULE | Freq: Every day | ORAL | Status: DC
  Administered 2014-10-27 – 2014-10-29 (×4): 0.4 mg via ORAL
  Filled 2014-10-26 (×3): qty 1

## 2014-10-26 MED ORDER — NITROGLYCERIN 2 % TD OINT
1.0000 [in_us] | TOPICAL_OINTMENT | Freq: Once | TRANSDERMAL | Status: DC
Start: 2014-10-26 — End: 2014-10-26

## 2014-10-26 MED ORDER — ONDANSETRON HCL 4 MG PO TABS: 4.0000 mg | ORAL_TABLET | Freq: Four times a day (QID) | ORAL | Status: DC | PRN

## 2014-10-26 MED ORDER — INSULIN NPH (HUMAN) (ISOPHANE) 100 UNIT/ML ~~LOC~~ SUSP: 26.0000 [IU] | Freq: Two times a day (BID) | SUBCUTANEOUS | Status: DC

## 2014-10-26 MED ORDER — ENOXAPARIN SODIUM 30 MG/0.3ML ~~LOC~~ SOLN
30.0000 mg | Freq: Every day | SUBCUTANEOUS | Status: DC
  Administered 2014-10-27 – 2014-10-28 (×3): 30 mg via SUBCUTANEOUS
  Filled 2014-10-26 (×4): qty 0.3

## 2014-10-26 MED ORDER — ACETAMINOPHEN 650 MG RE SUPP: 650.0000 mg | Freq: Four times a day (QID) | RECTAL | Status: DC | PRN

## 2014-10-26 MED ORDER — CALCITRIOL 0.25 MCG PO CAPS
0.2500 ug | ORAL_CAPSULE | Freq: Every day | ORAL | Status: DC
  Administered 2014-10-27 – 2014-10-29 (×3): 0.25 ug via ORAL
  Filled 2014-10-26 (×3): qty 1

## 2014-10-26 MED ORDER — ASPIRIN EC 81 MG PO TBEC
81.0000 mg | DELAYED_RELEASE_TABLET | Freq: Every day | ORAL | Status: DC
  Administered 2014-10-27 – 2014-10-29 (×3): 81 mg via ORAL
  Filled 2014-10-26 (×4): qty 1

## 2014-10-26 MED ORDER — AMLODIPINE BESYLATE 10 MG PO TABS
10.0000 mg | ORAL_TABLET | Freq: Every day | ORAL | Status: DC
  Administered 2014-10-27 – 2014-10-29 (×3): 10 mg via ORAL
  Filled 2014-10-26 (×3): qty 1

## 2014-10-26 MED ORDER — ENOXAPARIN SODIUM 40 MG/0.4ML ~~LOC~~ SOLN: 40.0000 mg | SUBCUTANEOUS | Status: DC

## 2014-10-26 MED ORDER — BRIMONIDINE TARTRATE 0.15 % OP SOLN
1.0000 [drp] | Freq: Three times a day (TID) | OPHTHALMIC | Status: DC
  Filled 2014-10-26: qty 5

## 2014-10-26 MED ORDER — BRIMONIDINE TARTRATE 0.2 % OP SOLN
1.0000 [drp] | Freq: Three times a day (TID) | OPHTHALMIC | Status: DC
  Administered 2014-10-27 – 2014-10-29 (×7): 1 [drp] via OPHTHALMIC
  Filled 2014-10-26: qty 5

## 2014-10-26 MED ORDER — ASPIRIN 81 MG PO TABS: 81.0000 mg | ORAL_TABLET | Freq: Every day | ORAL | Status: DC

## 2014-10-26 MED ORDER — INSULIN NPH (HUMAN) (ISOPHANE) 100 UNIT/ML ~~LOC~~ SUSP
26.0000 [IU] | Freq: Every day | SUBCUTANEOUS | Status: DC
  Administered 2014-10-27 (×2): 26 [IU] via SUBCUTANEOUS

## 2014-10-26 MED ORDER — INSULIN ASPART 100 UNIT/ML ~~LOC~~ SOLN
0.0000 [IU] | Freq: Three times a day (TID) | SUBCUTANEOUS | Status: DC
  Administered 2014-10-27 – 2014-10-28 (×3): 1 [IU] via SUBCUTANEOUS
  Administered 2014-10-28: 3 [IU] via SUBCUTANEOUS
  Administered 2014-10-29: 1 [IU] via SUBCUTANEOUS
  Administered 2014-10-29: 2 [IU] via SUBCUTANEOUS

## 2014-10-26 NOTE — ED Notes (Signed)
Pt presents to department for evaluation of generalized weakness. Wife states he is having difficulty walking at home, fell several days ago walking upstairs. Denies pain. Pt is alert and oriented x4.

## 2014-10-26 NOTE — ED Notes (Signed)
Spoke to pt's daughter on phone with permission of patient, family requesting xrays for knees due to falling.

## 2014-10-26 NOTE — ED Notes (Signed)
Meal tray ordered per Dr.Zammit

## 2014-10-26 NOTE — ED Provider Notes (Signed)
CSN: [id number]     Arrival date & time 10/26/14  1339 History   First MD Initiated Contact with Patient 10/26/14 1637     Chief Complaint  Patient presents with  . Weakness     (Consider location/radiation/quality/duration/timing/severity/associated sxs/prior Treatment) Patient is a 81 y.o. male presenting with weakness. The history is provided by the patient (the pt fell a few days ago and has mild  back pain and weakness walking.  pt cannot ambulate with out assistance).  Weakness This is a new problem. The current episode started more than 2 days ago. The problem occurs constantly. The problem has not changed since onset.Pertinent negatives include no chest pain, no abdominal pain and no headaches. Nothing aggravates the symptoms. Nothing relieves the symptoms.    Past Medical History  Diagnosis Date  . BPH (benign prostatic hyperplasia)   . Bladder cancer   . Diabetes mellitus without complication    Past Surgical History  Procedure Laterality Date  . Eye surgery      Cataract   Family History  Problem Relation Age of Onset  . Diabetes Sister    History  Substance Use Topics  . Smoking status: Former Smoker    Start date: 05/05/1968  . Smokeless tobacco: Never Used  . Alcohol Use: No    Review of Systems  Constitutional: Negative for appetite change and fatigue.  HENT: Negative for congestion, ear discharge and sinus pressure.   Eyes: Negative for discharge.  Respiratory: Negative for cough.   Cardiovascular: Negative for chest pain.  Gastrointestinal: Negative for abdominal pain and diarrhea.  Genitourinary: Negative for frequency and hematuria.  Musculoskeletal: Negative for back pain.  Skin: Negative for rash.  Neurological: Positive for weakness. Negative for seizures and headaches.  Psychiatric/Behavioral: Negative for hallucinations.      Allergies  Review of patient's allergies indicates no known allergies.  Home Medications   Prior to Admission  medications   Medication Sig Start Date End Date Taking? Authorizing Provider  ACCU-CHEK FASTCLIX LANCETS MISC Use to check blood sugars 2 times per day 04/01/13   Elayne Snare, MD  amLODipine (NORVASC) 10 MG tablet Take 10 mg by mouth daily.    Historical Provider, MD  aspirin 81 MG tablet Take 81 mg by mouth daily.    Historical Provider, MD  brimonidine (ALPHAGAN) 0.15 % ophthalmic solution  09/02/13   Historical Provider, MD  calcitRIOL (ROCALTROL) 0.25 MCG capsule Take 1 capsule (0.25 mcg total) by mouth daily. 05/28/14   Elayne Snare, MD  cholecalciferol (VITAMIN D) 400 UNITS TABS tablet Take 400 Units by mouth.    Historical Provider, MD  donepezil (ARICEPT) 10 MG tablet Take 10 mg by mouth at bedtime.    Historical Provider, MD  doxazosin (CARDURA) 8 MG tablet Take 8 mg by mouth daily.     Historical Provider, MD  glucose blood (ACCU-CHEK SMARTVIEW) test strip Use as instructed to check blood sugars 2 times per day, dx code 250.00 12/07/13   Elayne Snare, MD  insulin NPH (HUMULIN N,NOVOLIN N) 100 UNIT/ML injection Inject 30 Units into the skin 2 (two) times daily.     Historical Provider, MD  insulin regular (NOVOLIN R,HUMULIN R) 100 units/mL injection Inject 8 Units into the skin 2 (two) times daily before a meal.     Historical Provider, MD  pravastatin (PRAVACHOL) 40 MG tablet Take 40 mg by mouth daily.    Historical Provider, MD  tamsulosin (FLOMAX) 0.4 MG CAPS capsule Take 0.4 mg by mouth.  Historical Provider, MD  timolol (TIMOPTIC) 0.5 % ophthalmic solution  03/30/14   Historical Provider, MD   BP 202/60 mmHg  Pulse 50  Temp(Src) 97.5 F (36.4 C) (Oral)  Resp 17  SpO2 99% Physical Exam  Constitutional: He is oriented to person, place, and time. He appears well-developed.  HENT:  Head: Normocephalic.  Eyes: Conjunctivae and EOM are normal. No scleral icterus.  Neck: Neck supple. No thyromegaly present.  Cardiovascular: Normal rate and regular rhythm.  Exam reveals no gallop and no  friction rub.   No murmur heard. Pulmonary/Chest: No stridor. He has no wheezes. He has no rales. He exhibits no tenderness.  Abdominal: He exhibits no distension. There is no tenderness. There is no rebound.  Musculoskeletal: Normal range of motion. He exhibits no edema.  Weakness in legs when ambulating  Lymphadenopathy:    He has no cervical adenopathy.  Neurological: He is oriented to person, place, and time. He exhibits normal muscle tone. Coordination normal.  Skin: No rash noted. No erythema.  Psychiatric: He has a normal mood and affect. His behavior is normal.    ED Course  Procedures (including critical care time) Labs Review Labs Reviewed  CBC WITH DIFFERENTIAL/PLATELET - Abnormal; Notable for the following:    HCT 38.2 (*)    Platelets 124 (*)    All other components within normal limits  COMPREHENSIVE METABOLIC PANEL - Abnormal; Notable for the following:    CO2 20 (*)    Glucose, Bld 150 (*)    Creatinine, Ser 1.88 (*)    ALT 14 (*)    GFR calc non Af Amer 30 (*)    GFR calc Af Amer 34 (*)    All other components within normal limits  URINALYSIS, ROUTINE W REFLEX MICROSCOPIC (NOT AT [facility]) - Abnormal; Notable for the following:    Hgb urine dipstick TRACE (*)    Protein, ur 30 (*)    All other components within normal limits  URINE MICROSCOPIC-ADD ON  Randolm Idol, ED    Imaging Review Dg Lumbar Spine Complete  10/26/2014   CLINICAL DATA:  Lumbago for 2 weeks.  Patient fell 1 month prior  EXAM: LUMBAR SPINE - COMPLETE 4+ VIEW  COMPARISON:  None.  FINDINGS: Frontal, lateral, spot lumbosacral lateral, and bilateral oblique views were obtained. There are 5 non-rib-bearing lumbar type vertebral bodies. There is no fracture or spondylolisthesis. There is moderate disc space narrowing at L4-5 and L5-S1. There is also disc space narrowing in the visualized lower thoracic levels.  There are osteophytes and syndesmophytes in the lumbar spine. No sacroiliitis appreciable.  There is facet osteoarthritic change at all levels bilaterally.  IMPRESSION: Multilevel osteoarthritic change. There also syndesmotic fights suggesting superimposed seronegative spondyloarthropathy. No sacroiliitis. No fracture or spondylolisthesis.   Electronically Signed   By: Lowella Grip III M.D.   On: 10/26/2014 18:17   Ct Head Wo Contrast  10/26/2014   CLINICAL DATA:  Generalized weakness. Difficulty walking. Fall several days ago.  EXAM: CT HEAD WITHOUT CONTRAST  TECHNIQUE: Contiguous axial images were obtained from the base of the skull through the vertex without intravenous contrast.  COMPARISON:  04/11/2012.  FINDINGS: No mass lesion, mass effect, midline shift, hydrocephalus, hemorrhage. No acute territorial cortical ischemia/infarct. Atrophy and chronic ischemic white matter disease is present. Dystrophic calcification is present at the a atrium of the LEFT lateral ventricle. This is a chronic finding. Severe RIGHT temporomandibular joint osteoarthritis. RIGHT lens extraction.  IMPRESSION: Atrophy and chronic ischemic white  matter disease without acute intracranial abnormality.   Electronically Signed   By: Dereck Ligas M.D.   On: 10/26/2014 17:52     EKG Interpretation   Date/Time:  Tuesday October 26 2014 14:23:41 EDT Ventricular Rate:  52 PR Interval:  218 QRS Duration: 82 QT Interval:  460 QTC Calculation: 427 R Axis:   25 Text Interpretation:  Sinus bradycardia with 1st degree A-V block T wave  abnormality, consider inferior ischemia Abnormal ECG Confirmed by Khamari Sheehan   MD, [facility] [phone]) on 10/26/2014 5:03:35 PM      MDM   Final diagnoses:  Weakness    Fall,  Back pain with lower extr weakness.   Will admit to observation,  Consider more imaging.  Most likely contusion to back and weakness from age of 89    Milton Ferguson, MD 10/26/14 2000

## 2014-10-26 NOTE — H&P (Addendum)
[facility] History and Physical  Lynell Kussman [facility] SAY:[id number] DOB: [birth year] DOA: 10/26/2014  Referring physician: Dr.Zammit. PCP: Irven Shelling, MD  Specialists: Dr.Kumar. Endocrinologist.  Chief Complaint: Low back pain and weakness.  HPI: Alexis Lopez is a 81 y.o. male with history of diabetes mellitus, hypertension, chronic kidney disease and dementia was referred to the ER after patient was found to have increasing difficulty walking. Patient states he had fell last week after missing steps. Denies hitting his head or losing consciousness. Since his fall was noted that patient was having increasing difficulty walking and low back pain on trying to walk. Patient denies any incontinence of urine or bowel. Patient had gone to visit his PCP when he was found to have difficulty walking and increased pain on trying to walk in his low back. And was referred to the ER. On exam patient does not have any hyperreflexia of the lower extremity. At rest patient does not have any pain in his low back. Patient will be admitted for further observation. Patient is also found to have sinus bradycardia which I think is chronic as per the charts.  Review of Systems: As presented in the history of presenting illness, rest negative.  Past Medical History  Diagnosis Date  . BPH (benign prostatic hyperplasia)   . Bladder cancer   . Diabetes mellitus without complication   . Hypertension   . Chronic kidney disease    Past Surgical History  Procedure Laterality Date  . Eye surgery      Cataract   Social History:  reports that he has quit smoking. He started smoking about 46 years ago. He has never used smokeless tobacco. He reports that he does not drink alcohol or use illicit drugs. Where does patient live home. Can patient participate in ADLs? Not sure.  No Known Allergies  Family History:  Family History  Problem Relation Age of Onset  . Diabetes Sister       Prior to Admission  medications   Medication Sig Start Date End Date Taking? Authorizing Provider  amLODipine (NORVASC) 10 MG tablet Take 10 mg by mouth daily.   Yes Historical Provider, MD  aspirin 81 MG tablet Take 81 mg by mouth daily.   Yes Historical Provider, MD  calcitRIOL (ROCALTROL) 0.25 MCG capsule Take 1 capsule (0.25 mcg total) by mouth daily. 05/28/14  Yes Elayne Snare, MD  donepezil (ARICEPT) 10 MG tablet Take 10 mg by mouth at bedtime.   Yes Historical Provider, MD  doxazosin (CARDURA) 8 MG tablet Take 8 mg by mouth daily.    Yes Historical Provider, MD  insulin NPH (HUMULIN N,NOVOLIN N) 100 UNIT/ML injection Inject 26-30 Units into the skin 2 (two) times daily. 30 units every morning and 26 units every evening.   Yes Historical Provider, MD  insulin regular (NOVOLIN R,HUMULIN R) 100 units/mL injection Inject 8 Units into the skin 2 (two) times daily before a meal.    Yes Historical Provider, MD  pravastatin (PRAVACHOL) 40 MG tablet Take 40 mg by mouth daily.   Yes Historical Provider, MD  ACCU-CHEK FASTCLIX LANCETS MISC Use to check blood sugars 2 times per day 04/01/13   Elayne Snare, MD  brimonidine (ALPHAGAN) 0.15 % ophthalmic solution Place 1-2 drops into both eyes 3 (three) times daily.  09/02/13   Historical Provider, MD  glucose blood (ACCU-CHEK SMARTVIEW) test strip Use as instructed to check blood sugars 2 times per day, dx code 250.00 12/07/13   Elayne Snare, MD  tamsulosin (FLOMAX) 0.4 MG CAPS capsule Take 0.4 mg by mouth daily.     Historical Provider, MD  timolol (TIMOPTIC) 0.5 % ophthalmic solution Place 1-2 drops into both eyes 2 (two) times daily.  03/30/14   Historical Provider, MD    Physical Exam: Filed Vitals:   10/26/14 1827 10/26/14 1930 10/26/14 1945 10/26/14 2000  BP: 192/67 202/60 205/68 198/118  Pulse: 48 50 45 68  Temp:      TempSrc:      Resp: 18 17 16 23   SpO2: 100% 99% 100% 97%     General:  Moderately built and nourished.  Eyes: Anicteric no pallor.  ENT: No discharge  from the ears eyes nose or mouth.  Neck: No mass felt.  Cardiovascular: S1 and S2 heard.  Respiratory: No rhonchi or crepitations.  Abdomen: Soft nontender bowel sounds present.  Skin: No rash.  Musculoskeletal: No edema.  Psychiatric: Patient has dementia but presently is alert and awake.  Neurologic: Alert awake oriented to time place and person. Patient is moving all extremities. There is no lower extremity hyperreflexia.  Labs on Admission:  Basic Metabolic Panel:  Recent Labs Lab 10/26/14 1430  NA 138  K 4.7  CL 109  CO2 20*  GLUCOSE 150*  BUN 20  CREATININE 1.88*  CALCIUM 8.9   Liver Function Tests:  Recent Labs Lab 10/26/14 1430  AST 29  ALT 14*  ALKPHOS 75  BILITOT 0.8  PROT 6.8  ALBUMIN 3.6   No results for input(s): LIPASE, AMYLASE in the last 168 hours. No results for input(s): AMMONIA in the last 168 hours. CBC:  Recent Labs Lab 10/26/14 1430  WBC 5.1  NEUTROABS 3.0  HGB 13.0  HCT 38.2*  MCV 89.0  PLT 124*   Cardiac Enzymes: No results for input(s): CKTOTAL, CKMB, CKMBINDEX, TROPONINI in the last 168 hours.  BNP (last 3 results) No results for input(s): BNP in the last 8760 hours.  ProBNP (last 3 results) No results for input(s): PROBNP in the last 8760 hours.  CBG: No results for input(s): GLUCAP in the last 168 hours.  Radiological Exams on Admission: Dg Lumbar Spine Complete  10/26/2014   CLINICAL DATA:  Lumbago for 2 weeks.  Patient fell 1 month prior  EXAM: LUMBAR SPINE - COMPLETE 4+ VIEW  COMPARISON:  None.  FINDINGS: Frontal, lateral, spot lumbosacral lateral, and bilateral oblique views were obtained. There are 5 non-rib-bearing lumbar type vertebral bodies. There is no fracture or spondylolisthesis. There is moderate disc space narrowing at L4-5 and L5-S1. There is also disc space narrowing in the visualized lower thoracic levels.  There are osteophytes and syndesmophytes in the lumbar spine. No sacroiliitis appreciable.  There is facet osteoarthritic change at all levels bilaterally.  IMPRESSION: Multilevel osteoarthritic change. There also syndesmotic fights suggesting superimposed seronegative spondyloarthropathy. No sacroiliitis. No fracture or spondylolisthesis.   Electronically Signed   By: Lowella Grip III M.D.   On: 10/26/2014 18:17   Ct Head Wo Contrast  10/26/2014   CLINICAL DATA:  Generalized weakness. Difficulty walking. Fall several days ago.  EXAM: CT HEAD WITHOUT CONTRAST  TECHNIQUE: Contiguous axial images were obtained from the base of the skull through the vertex without intravenous contrast.  COMPARISON:  04/11/2012.  FINDINGS: No mass lesion, mass effect, midline shift, hydrocephalus, hemorrhage. No acute territorial cortical ischemia/infarct. Atrophy and chronic ischemic white matter disease is present. Dystrophic calcification is present at the a atrium of the LEFT lateral ventricle. This is a chronic finding.  Severe RIGHT temporomandibular joint osteoarthritis. RIGHT lens extraction.  IMPRESSION: Atrophy and chronic ischemic white matter disease without acute intracranial abnormality.   Electronically Signed   By: Dereck Ligas M.D.   On: 10/26/2014 17:52    EKG: Independently reviewed. Sinus bradycardia.  Assessment/Plan Active Problems:   Essential hypertension   Chronic kidney disease, stage III (moderate)   Weakness   Sinus bradycardia   Diabetes mellitus type 2, controlled   Chronic anemia   Thrombocytopenia   Low back pain   1. Low back pain with difficulty walking - patient's low back is only on trying to walk. Patient finds it difficult to walk due to weakness. At this time I have ordered MRI of the lumbar spine. Get physical therapy consult. 2. Sinus bradycardia - appears to be chronic. Check TSH, holding off Aricept. Closely monitor in telemetry. 3. Hypertension uncontrolled - continue home medications and I have placed patient on when necessary IV hydralazine. Closely  monitor blood pressure trends. 4. Diabetes mellitus type 2 - on insulin at home continue home dose. Check CBGs. 5. Chronic anemia and thrombocytopenia - follow CBC. 6. Chronic kidney disease stage III - follow metabolic panel.  I have reviewed patient's old charts of labs.  Addendum - the radiologist called me stating that patient's MRI lumbosacral spine shows cord compression at T10-11 level. Patient also has severe stenosis. I have discussed with Dr. Hal Neer on-call neurosurgeon who had reviewed patient's MRI and at this time felt that given patient's comorbidities patient will not be an ideal surgical candidate and to place patient on Decadron IV for 3 days and slowly taper off on oral steroids. Get physical therapy consult. I have ordered Decadron 10 mg IV 1 dose followed by 4 mg IV to every 6 hourly. After 3 days of IV steroids start patient on oral steroids Closely follow CBGs as patient has diabetes and patient's long-acting insulin doses may be increased. Patient probably will need rehabilitation.   DVT Prophylaxis SCDs.  Code Status: Full code.  Family Communication: Discussed with patient and wife.  Disposition Plan: Admit for observation.    KAKRAKANDY,ARSHAD N. [facility] Pager [phone].  If 7PM-7AM, please contact night-coverage [email or web address] Password [facility] 10/26/2014, 9:57 PM

## 2014-10-26 NOTE — ED Notes (Signed)
MD at bedside. 

## 2014-10-27 ENCOUNTER — Other Ambulatory Visit: Payer: 59

## 2014-10-27 ENCOUNTER — Encounter (HOSPITAL_COMMUNITY): Payer: Self-pay | Admitting: *Deleted

## 2014-10-27 DIAGNOSIS — Z87891 Personal history of nicotine dependence: Secondary | ICD-10-CM | POA: Diagnosis not present

## 2014-10-27 DIAGNOSIS — N183 Chronic kidney disease, stage 3 (moderate): Secondary | ICD-10-CM

## 2014-10-27 DIAGNOSIS — D649 Anemia, unspecified: Secondary | ICD-10-CM | POA: Diagnosis present

## 2014-10-27 DIAGNOSIS — W19XXXA Unspecified fall, initial encounter: Secondary | ICD-10-CM | POA: Diagnosis present

## 2014-10-27 DIAGNOSIS — I129 Hypertensive chronic kidney disease with stage 1 through stage 4 chronic kidney disease, or unspecified chronic kidney disease: Secondary | ICD-10-CM | POA: Diagnosis present

## 2014-10-27 DIAGNOSIS — G9520 Unspecified cord compression: Secondary | ICD-10-CM | POA: Diagnosis present

## 2014-10-27 DIAGNOSIS — R262 Difficulty in walking, not elsewhere classified: Secondary | ICD-10-CM | POA: Diagnosis present

## 2014-10-27 DIAGNOSIS — M545 Low back pain: Secondary | ICD-10-CM

## 2014-10-27 DIAGNOSIS — E119 Type 2 diabetes mellitus without complications: Secondary | ICD-10-CM

## 2014-10-27 DIAGNOSIS — R001 Bradycardia, unspecified: Secondary | ICD-10-CM | POA: Diagnosis present

## 2014-10-27 DIAGNOSIS — F0390 Unspecified dementia without behavioral disturbance: Secondary | ICD-10-CM | POA: Diagnosis present

## 2014-10-27 DIAGNOSIS — M5126 Other intervertebral disc displacement, lumbar region: Secondary | ICD-10-CM | POA: Diagnosis present

## 2014-10-27 DIAGNOSIS — E1122 Type 2 diabetes mellitus with diabetic chronic kidney disease: Secondary | ICD-10-CM | POA: Diagnosis present

## 2014-10-27 DIAGNOSIS — Z8551 Personal history of malignant neoplasm of bladder: Secondary | ICD-10-CM | POA: Diagnosis not present

## 2014-10-27 DIAGNOSIS — Z7982 Long term (current) use of aspirin: Secondary | ICD-10-CM | POA: Diagnosis not present

## 2014-10-27 DIAGNOSIS — I10 Essential (primary) hypertension: Secondary | ICD-10-CM

## 2014-10-27 DIAGNOSIS — M4804 Spinal stenosis, thoracic region: Secondary | ICD-10-CM | POA: Diagnosis present

## 2014-10-27 DIAGNOSIS — D696 Thrombocytopenia, unspecified: Secondary | ICD-10-CM | POA: Diagnosis present

## 2014-10-27 DIAGNOSIS — N400 Enlarged prostate without lower urinary tract symptoms: Secondary | ICD-10-CM | POA: Diagnosis present

## 2014-10-27 DIAGNOSIS — I714 Abdominal aortic aneurysm, without rupture: Secondary | ICD-10-CM | POA: Diagnosis present

## 2014-10-27 DIAGNOSIS — Z79899 Other long term (current) drug therapy: Secondary | ICD-10-CM | POA: Diagnosis not present

## 2014-10-27 DIAGNOSIS — Z794 Long term (current) use of insulin: Secondary | ICD-10-CM | POA: Diagnosis not present

## 2014-10-27 DIAGNOSIS — M4806 Spinal stenosis, lumbar region: Secondary | ICD-10-CM | POA: Diagnosis present

## 2014-10-27 LAB — BASIC METABOLIC PANEL
ANION GAP: 6 (ref 5–15)
BUN: 22 mg/dL — ABNORMAL HIGH (ref 6–20)
CALCIUM: 8.6 mg/dL — AB (ref 8.9–10.3)
CO2: 25 mmol/L (ref 22–32)
Chloride: 109 mmol/L (ref 101–111)
Creatinine, Ser: 1.87 mg/dL — ABNORMAL HIGH (ref 0.61–1.24)
GFR calc Af Amer: 35 mL/min — ABNORMAL LOW (ref >60)
GFR calc non Af Amer: 30 mL/min — ABNORMAL LOW (ref >60)
Glucose, Bld: 112 mg/dL — ABNORMAL HIGH (ref 65–99)
Potassium: 4.6 mmol/L (ref 3.5–5.1)
SODIUM: 140 mmol/L (ref 135–145)

## 2014-10-27 LAB — GLUCOSE, CAPILLARY
GLUCOSE-CAPILLARY: 85 mg/dL (ref 65–99)
GLUCOSE-CAPILLARY: 213 mg/dL — AB (ref 65–99)
Glucose-Capillary: 137 mg/dL — ABNORMAL HIGH (ref 65–99)
Glucose-Capillary: 93 mg/dL (ref 65–99)
Glucose-Capillary: 131 mg/dL — ABNORMAL HIGH (ref 65–99)

## 2014-10-27 LAB — CBC
HEMATOCRIT: 36 % — AB (ref 39.0–52.0)
Hemoglobin: 12 g/dL — ABNORMAL LOW (ref 13.0–17.0)
MCH: 30.2 pg (ref 26.0–34.0)
MCHC: 33.3 g/dL (ref 30.0–36.0)
MCV: 90.7 fL (ref 78.0–100.0)
Platelets: 120 10*3/uL — ABNORMAL LOW (ref 150–400)
RBC: 3.97 MIL/uL — AB (ref 4.22–5.81)
RDW: 13.3 % (ref 11.5–15.5)
WBC: 5.5 10*3/uL (ref 4.0–10.5)

## 2014-10-27 LAB — TSH: TSH: 2.063 u[IU]/mL (ref 0.350–4.500)

## 2014-10-27 MED ORDER — DEXAMETHASONE SODIUM PHOSPHATE 10 MG/ML IJ SOLN
10.0000 mg | Freq: Once | INTRAMUSCULAR | Status: AC
  Administered 2014-10-27: 10 mg via INTRAVENOUS
  Filled 2014-10-27: qty 1

## 2014-10-27 MED ORDER — HYDRALAZINE HCL 20 MG/ML IJ SOLN
10.0000 mg | INTRAMUSCULAR | Status: DC | PRN
  Administered 2014-10-28: 10 mg via INTRAVENOUS
  Filled 2014-10-27: qty 1

## 2014-10-27 MED ORDER — HYDROCODONE-ACETAMINOPHEN 5-325 MG PO TABS
2.0000 | ORAL_TABLET | Freq: Once | ORAL | Status: AC
  Administered 2014-10-27: 2 via ORAL
  Filled 2014-10-27: qty 2

## 2014-10-27 MED ORDER — PRAVASTATIN SODIUM 40 MG PO TABS
40.0000 mg | ORAL_TABLET | Freq: Every day | ORAL | Status: DC
  Administered 2014-10-28: 40 mg via ORAL
  Filled 2014-10-27 (×2): qty 1

## 2014-10-27 MED ORDER — DEXAMETHASONE SODIUM PHOSPHATE 4 MG/ML IJ SOLN
4.0000 mg | Freq: Four times a day (QID) | INTRAMUSCULAR | Status: DC
  Administered 2014-10-27 – 2014-10-29 (×7): 4 mg via INTRAVENOUS
  Filled 2014-10-27 (×12): qty 1

## 2014-10-27 MED ORDER — CYCLOBENZAPRINE HCL 5 MG PO TABS
5.0000 mg | ORAL_TABLET | Freq: Once | ORAL | Status: AC
  Administered 2014-10-27: 5 mg via ORAL
  Filled 2014-10-27 (×2): qty 1

## 2014-10-27 MED ORDER — KETOROLAC TROMETHAMINE 15 MG/ML IJ SOLN
15.0000 mg | Freq: Once | INTRAMUSCULAR | Status: AC
Start: 2014-10-27 — End: 2014-10-27
  Administered 2014-10-27: 15 mg via INTRAVENOUS
  Filled 2014-10-27: qty 1

## 2014-10-27 MED ORDER — HYDROCODONE-ACETAMINOPHEN 5-325 MG PO TABS: 1.0000 | ORAL_TABLET | ORAL | Status: DC | PRN

## 2014-10-27 NOTE — Progress Notes (Signed)
Spoke with K. Schorr about pt's insulin. Pt was unsure of last time he took his insulin. After discussing this case with Schorr it was decided to go ahead and give insulin dose now and will recheck blood sugar in 1.5 hours.

## 2014-10-27 NOTE — Evaluation (Signed)
Physical Therapy Evaluation Patient Details Name: Alexis Lopez MRN: [id number] DOB: 1920/05/24 Today's Date: 10/27/2014   History of Present Illness  Pt is a 81 y/o male with a PMH of DM, HTN, CKD, dementia. Pt presents to the ED after a fall last week and progressive weakness/difficulty ambulating with increased LBP.    Clinical Impression  Pt admitted with above diagnosis. Pt currently with functional limitations due to the deficits listed below (see PT Problem List). At the time of PT eval pt was able to perform transfers and ambulation with hands-on guarding for safety and use of a RW. At baseline, pt is independent without an AD and is often home alone as wife works several days a week.    Pt will benefit from skilled PT to increase their independence and safety with mobility to allow discharge to the venue listed below. Feel that 24 hour assist is the safest option at this time as pt has a history of dementia and falls at home. Pt's daughter was present during eval, and states that she is not sure whether 24 hour assist could be arranged (she will only be available a few days as she lives out of town).   STR at the SNF level may be a more appropriate option to improve function, teach compensation strategies for stenosis/cord compression and energy conservation. Daughter appeared open to this option and states she will discuss with family members. If pt/family chooses home, pt will require 24 hour assistance, HHPT and a RW.  A 3-in-1 BSC would be beneficial for energy conservation as a shower chair and increased safety/support when rising from the toilet.      Follow Up Recommendations SNF;Supervision/Assistance - 24 hour    Equipment Recommendations  Rolling walker with 5" wheels;3in1 (PT)    Recommendations for Other Services       Precautions / Restrictions Precautions Precautions: Fall;Back Precaution Comments: Back precautions for comfort Restrictions Weight Bearing  Restrictions: No      Mobility  Bed Mobility Overal bed mobility: Needs Assistance Bed Mobility: Supine to Sit     Supine to sit: Supervision     General bed mobility comments: Pt was cued for log roll technique for comfort, however pt states he is afraid of rolling out of the bed. Continued to cue pt but he wanted to sit straight up and swing his legs over the side of bed instead. Pt was educated on benefits of log roll. Overall at a supervision level for safety.   Transfers Overall transfer level: Needs assistance Equipment used: Rolling walker (2 wheeled) Transfers: Sit to/from Stand Sit to Stand: Supervision         General transfer comment: Supervision for safety. VC's for hand placement on seated surface for safety. Pt was able to power-up to full standing position with minimal unsteadiness noted without UE support.   Ambulation/Gait Ambulation/Gait assistance: Min guard Ambulation Distance (Feet): 125 Feet Assistive device: Rolling walker (2 wheeled) Gait Pattern/deviations: Decreased stride length;Shuffle;Trunk flexed Gait velocity: Decreased Gait velocity interpretation: Below normal speed for age/gender General Gait Details: Pt required frequent cueing for RW placement close to pt's body. He maintained a very flexed posture as a position of comfort, and was only able to maintain a more upright posture briefly. Generally steady with the RW, would recommend close guard for safety.   Stairs            Wheelchair Mobility    Modified Rankin (Stroke Patients Only)  Balance Overall balance assessment: Needs assistance Sitting-balance support: Feet supported;No upper extremity supported Sitting balance-Leahy Scale: Fair     Standing balance support: No upper extremity supported;During functional activity Standing balance-Leahy Scale: Poor Standing balance comment: Requires UE support to maintain standing balance.                               Pertinent Vitals/Pain Pain Assessment: Faces Faces Pain Scale: Hurts little more Pain Location: Mid-low back Pain Descriptors / Indicators: Grimacing;Aching Pain Intervention(s): Limited activity within patient's tolerance;Monitored during session;Repositioned    Home Living Family/patient expects to be discharged to:: Private residence Living Arrangements: Spouse/significant other Available Help at Discharge: Family;Available PRN/intermittently (Wife works ) Type of Home: House Home Access: Stairs to enter   [employer] of Steps: 2-3 [place]: Two level;Able to live on main level with bedroom/bathroom Home Equipment: Walker - 2 wheels      Prior Function Level of Independence: Independent with assistive device(s)         Comments: Was using a walker the past week for ambulation after progressive weakness. Prior to that states he was independent, driving, and exexercising 2-3x/week at home on bike or treadmill     Hand Dominance        Extremity/Trunk Assessment   Upper Extremity Assessment: Overall WFL for tasks assessed (Age-appropriate strength deficits)           Lower Extremity Assessment: Overall WFL for tasks assessed;RLE deficits/detail (Age appropriate strength deficits. L hip painful with MMT) RLE Deficits / Details: Noted R knee valgus. Medial knee rubbing L knee during swing through of gait cycle.     Cervical / Trunk Assessment: Kyphotic;Other exceptions  Communication   Communication: HOH  Cognition Arousal/Alertness: Awake/alert Behavior During Therapy: WFL for tasks assessed/performed Overall Cognitive Status: History of cognitive impairments - at baseline (PMH of dementia. Appeared mostly WFL)       Memory: Decreased short-term memory              General Comments      Exercises        Assessment/Plan    PT Assessment Patient needs continued PT services  PT Diagnosis Difficulty walking;Acute pain   PT  Problem List Decreased strength;Decreased range of motion;Decreased activity tolerance;Decreased balance;Decreased mobility;Decreased knowledge of use of DME;Decreased safety awareness;Decreased knowledge of precautions;Pain  PT Treatment Interventions DME instruction;Gait training;Stair training;Therapeutic activities;Functional mobility training;Therapeutic exercise;Neuromuscular re-education;Patient/family education   PT Goals (Current goals can be found in the Care Plan section) Acute Rehab PT Goals Patient Stated Goal: Return home today PT Goal Formulation: With patient/family Time For Goal Achievement: 11/03/14 Potential to Achieve Goals: Good    Frequency Min 3X/week   Barriers to discharge Decreased caregiver support Wife works; unsure if pt will have 24 hour support available.     Co-evaluation               End of Session Equipment Utilized During Treatment: Gait belt Activity Tolerance: Patient limited by fatigue Patient left: in chair;with call bell/phone within reach;with family/visitor present Nurse Communication: Mobility status    Functional Assessment Tool Used: Clinical judgement Functional Limitation: Mobility: Walking and moving around Mobility: Walking and Moving Around Current Status (P9509): At least 1 percent but less than 20 percent impaired, limited or restricted Mobility: Walking and Moving Around Goal Status [phone]): At least 1 percent but less than 20 percent impaired, limited or restricted    Time:  0827-0852 PT Time Calculation (min) (ACUTE ONLY): 25 min   Charges:   PT Evaluation $Initial PT Evaluation Tier I: 1 Procedure PT Treatments $Gait Training: 8-22 mins   PT G Codes:   PT G-Codes **NOT FOR INPATIENT CLASS** Functional Assessment Tool Used: Clinical judgement Functional Limitation: Mobility: Walking and moving around Mobility: Walking and Moving Around Current Status (N2355): At least 1 percent but less than 20 percent impaired,  limited or restricted Mobility: Walking and Moving Around Goal Status [phone]): At least 1 percent but less than 20 percent impaired, limited or restricted    Rolinda Roan 10/27/2014, 9:27 AM   Rolinda Roan, PT, DPT Acute Rehabilitation Services Pager: [phone]

## 2014-10-27 NOTE — Progress Notes (Signed)
[facility]                                                                              Patient Demographics  Alexis Lopez, is a 81 y.o. male, DOB - 07/26/1920, LNL:[id number]  Admit date - 10/26/2014   Admitting Physician Rise Patience, MD  Outpatient Primary MD for the patient is Irven Shelling, MD  LOS -    Chief Complaint  Patient presents with  . Weakness       Brief HPI   Alexis Lopez is a 81 y.o. male with history of diabetes mellitus, hypertension, chronic kidney disease and dementia was referred to the ER after patient was found to have increasing difficulty walking. Patient stated he had fell last week after missing steps. Denied hitting his head or losing consciousness. Since his fall was noted that patient was having increasing difficulty walking and low back pain on trying to walk. Patient denied any incontinence of urine or bowel. Patient had gone to visit his PCP when he was found to have difficulty walking and increased pain on trying to walk in his low back. And was referred to the ER. On exam patient does not have any hyperreflexia of the lower extremity. At rest patient did not have any pain in his low back. Patient was admitted for further workup.   Assessment & Plan    Principal Problem:   Low back pain with difficulty ambulating, weakness - MRI of the lumbar spine showed multifactorial degenerative changes with resultant severe canal stenosis and cord compression at the level of T11 and 12, severe canal stenosis at L4 on L5 with disc bulge. Intra-abdominal aortic aneurysm up to 3.6 cm. - Dr. Hal Hope, admitting physician discussed with on-call neurosurgery, Dr. Hal Neer who reviewed patient's MRI and felt that given the patient's comorbidities, will not be an ideal surgical candidate. Recommended IV Decadron for 3 days and then slowly taper off on cholesterol meds, PT consult, nursing facility rehabilitation.  Active Problems:  Essential hypertension - Currently stable, continue home medications, PRN hydralazine    Chronic kidney disease, stage III (moderate) -Currently stable, follow BMET     Sinus bradycardia - TSH normal, 2.06. hold off on Aricept    Diabetes mellitus type 2, controlled - Follow blood sugars closely, on Decadron - continue NPH insulin and sliding scale insulin correction factor    Chronic anemia,  Thrombocytopenia - Follow CBC   Code Status: full  Family Communication: Discussed in detail with the patient, all imaging results, lab results explained to the patient   Disposition Plan: not medically ready  Time Spent in minutes  25 minutes  Procedures  MRI lumbar spine   Consults  Neurosurgery  DVT Prophylaxis  SCD's  Medications  Scheduled Meds: . amLODipine  10 mg Oral Daily  . aspirin EC  81 mg Oral Daily  . brimonidine  1-2 drop Both Eyes TID  . calcitRIOL  0.25 mcg Oral Daily  . dexamethasone  4 mg Intravenous 4 times per day  . doxazosin  8 mg Oral Daily  . enoxaparin (LOVENOX) injection  30 mg Subcutaneous QHS  . insulin  aspart  0-9 Units Subcutaneous TID WC  . insulin NPH Human  30 Units Subcutaneous QAC breakfast   And  . insulin NPH Human  26 Units Subcutaneous QHS  . [START ON 10/28/2014] pravastatin  40 mg Oral q1800  . sodium chloride  3 mL Intravenous Q12H  . tamsulosin  0.4 mg Oral Daily  . timolol  1-2 drop Both Eyes BID   Continuous Infusions:  PRN Meds:.acetaminophen **OR** acetaminophen, hydrALAZINE, HYDROcodone-acetaminophen, ondansetron **OR** ondansetron (ZOFRAN) IV   Antibiotics   Anti-infectives    None        Subjective:   Alexis Lopez was seen and examined today. Still having some low back pain, although improving.,  Patient denies dizziness, chest pain, shortness of breath, abdominal pain, N/V/D/C. moving all 4 extremities. No acute events overnight.    Objective:   Blood pressure 173/40, pulse 54, temperature 97.4 F (36.3 C),  temperature source Oral, resp. rate 18, weight 81.375 kg (179 lb 6.4 oz), SpO2 100 %.  Wt Readings from Last 3 Encounters:  10/26/14 81.375 kg (179 lb 6.4 oz)  08/27/14 82.464 kg (181 lb 12.8 oz)  07/15/14 83.643 kg (184 lb 6.4 oz)     Intake/Output Summary (Last 24 hours) at 10/27/14 1130 Last data filed at 10/27/14 0442  Gross per 24 hour  Intake      0 ml  Output    400 ml  Net   -400 ml    Exam  General: Alert and oriented x 2, NAD  HEENT:  PERRLA, EOMI, Anicteric Sclera, mucous membranes moist.   Neck: Supple, no JVD, no masses  CVS: S1 S2 auscultated, no rubs, murmurs or gallops. Regular rate and rhythm.  Respiratory: Clear to auscultation bilaterally, no wheezing, rales or rhonchi  Abdomen: Soft, nontender, nondistended, + bowel sounds  Ext: no cyanosis clubbing or edema  Neuro: AAOx2, moving all extremities  Psych: Normal affect and demeanor, alert and oriented x2    Data Review   Micro Results No results found for this or any previous visit (from the past 240 hour(s)).  Radiology Reports Dg Lumbar Spine Complete  10/26/2014   CLINICAL DATA:  Lumbago for 2 weeks.  Patient fell 1 month prior  EXAM: LUMBAR SPINE - COMPLETE 4+ VIEW  COMPARISON:  None.  FINDINGS: Frontal, lateral, spot lumbosacral lateral, and bilateral oblique views were obtained. There are 5 non-rib-bearing lumbar type vertebral bodies. There is no fracture or spondylolisthesis. There is moderate disc Lopez narrowing at L4-5 and L5-S1. There is also disc Lopez narrowing in the visualized lower thoracic levels.  There are osteophytes and syndesmophytes in the lumbar spine. No sacroiliitis appreciable. There is facet osteoarthritic change at all levels bilaterally.  IMPRESSION: Multilevel osteoarthritic change. There also syndesmotic fights suggesting superimposed seronegative spondyloarthropathy. No sacroiliitis. No fracture or spondylolisthesis.   Electronically Signed   By: Lowella Grip III  M.D.   On: 10/26/2014 18:17   Ct Head Wo Contrast  10/26/2014   CLINICAL DATA:  Generalized weakness. Difficulty walking. Fall several days ago.  EXAM: CT HEAD WITHOUT CONTRAST  TECHNIQUE: Contiguous axial images were obtained from the base of the skull through the vertex without intravenous contrast.  COMPARISON:  04/11/2012.  FINDINGS: No mass lesion, mass effect, midline shift, hydrocephalus, hemorrhage. No acute territorial cortical ischemia/infarct. Atrophy and chronic ischemic white matter disease is present. Dystrophic calcification is present at the a atrium of the LEFT lateral ventricle. This is a chronic finding. Severe RIGHT temporomandibular joint osteoarthritis. RIGHT  lens extraction.  IMPRESSION: Atrophy and chronic ischemic white matter disease without acute intracranial abnormality.   Electronically Signed   By: Dereck Ligas M.D.   On: 10/26/2014 17:52   Mr Lumbar Spine Wo Contrast  10/26/2014   CLINICAL DATA:  Initial evaluation for acute weakness, low back pain, difficulty with ambulation.  EXAM: MRI LUMBAR SPINE WITHOUT CONTRAST  TECHNIQUE: Multiplanar, multisequence MR imaging of the lumbar spine was performed. No intravenous contrast was administered.  COMPARISON:  Prior radiograph from earlier the same day.  FINDINGS: For the purposes of this dictation, the lowest well-formed intervertebral disc spaces presumed to be the L5-S1 level, and there presumed to be 5 lumbar type vertebral bodies. There is transitional lumbosacral anatomy with partial sacralization of the L5 vertebral body. The L5-S1 disc is rudimentary.  There is 4 mm anterolisthesis of L4 on L5. Otherwise, vertebral bodies are normally aligned with preservation of the normal lumbar lordosis.  There is anterior and mid height loss along the right aspect of the superior endplate of F38. Height loss measures up to 80% of height loss. This appears late subacute to chronic in nature. Prominent degenerative endplate changes present  within the adjacent inferior endplate of V29 with associated degenerative Schmorl's node. Prominent anterior osteophytic spurring present at this level. Otherwise, vertebral body heights are preserved.  Conus medullaris terminates normally at the L1-2 level. Signal intensity within the visualized cord is normal.  No acute paraspinous soft tissue abnormality. Aneurysmal dilatation of the intra-abdominal aorta at to 3.7 cm. Scattered cyst noted within the partially visualized kidneys bilaterally.  T10-11: Seen only on sagittal projection. Degenerative intervertebral disc Lopez narrowing with intervertebral disc Lopez height loss and mild diffuse disc bulge. Mild canal stenosis. Foramina are grossly patent.  T11-12: Height loss along the right aspect of the superior endplate of V91 of up to 80%. No significant bony retropulsion. Degenerative endplate changes at the inferior endplate of Y60 with associated Schmorl's node. Prominent bulky anterior osteophytic spurring, abutting the posterior aspect of the aorta. There is superimposed diffuse disc bulging at this level. Severe bilateral facet arthrosis. There is a superimposed synovial cyst arising from the anteromedial aspect of the left T11-12 facet that measures 0.5 x 0.7 x 1.0 cm (series 500, image 5). There is resultant severe canal stenosis at this level with compression of the distal spinal cord. Thecal sac measures 4 mm at its most narrow point. No associated cord signal changes. Severe bilateral foraminal narrowing present as well.  T12-L1: No disc bulge or disc protrusion. Mild disc desiccation. Mild bilateral facet hypertrophy. No significant stenosis.  L1-2: No significant disc bulge or disc protrusion. Mild disc desiccation. Mild facet hypertrophy. No significant canal or foraminal stenosis.  L2-3: Small left foraminal disc protrusion without associated neural impingement (series 500, image 24). Facet and ligamentous hypertrophy. Resultant mild canal and  lateral recess stenosis. Foramina remain widely patent.  L3-4: Mild diffuse annular disc bulge without focal disc herniation. Predominately left-sided endplate osteophytic spurring. Mild facet and ligamentous hypertrophy. No significant canal or foraminal stenosis.  L4-5: 4 mm anterolisthesis of L4 on L5. There is associated diffuse disc bulge with severe bilateral facet arthrosis and ligamentum flavum hypertrophy. There is resultant severe canal stenosis with the thecal sac measuring approximately 6 mm in AP diameter at its most narrow point. Epidural lipomatosis within the ventral epidural Lopez posterior to the L4 vertebral body. Mild bilateral foraminal narrowing present as well.  L5-S1: Somewhat rudimentary L5-S1 disc. Prominent degenerative endplate changes  with mild diffuse disc bulge. Mild bilateral facet hypertrophy. No significant canal stenosis. Mild right foraminal narrowing.  IMPRESSION: 1. Multifactorial degenerative changes with resultant severe canal stenosis and cord compression at the level of T11-12 as detailed above. No associated cord signal changes. 2. 4 mm anterolisthesis of L4 on L5 with associated disc bulge and severe facet arthrosis, resulting in severe canal stenosis at this level. 3. Approximately 80% of height loss at the right aspect of the superior endplate of Q68, likely late subacute to chronic in nature. Vertebral body heights otherwise preserved. 4. Additional more mild multilevel degenerative changes as detailed above. 5. Transitional lumbosacral anatomy with partial sacralization of the L5 vertebral body. Careful correlation with the numbering system on this exam and prior plain film radiographs recommended prior to any potential future surgical intervention. 6. Intra-abdominal aortic aneurysm measuring up to 3.6 cm in diameter. Critical Value/emergent results were called by telephone at the time of interpretation on 10/26/2014 at 11:25 pm to Dr. Gean Birchwood , who verbally  acknowledged these results.   Electronically Signed   By: Jeannine Boga M.D.   On: 10/26/2014 23:28    CBC  Recent Labs Lab 10/26/14 1430 10/27/14 0545  WBC 5.1 5.5  HGB 13.0 12.0*  HCT 38.2* 36.0*  PLT 124* 120*  MCV 89.0 90.7  MCH 30.3 30.2  MCHC 34.0 33.3  RDW 13.3 13.3  LYMPHSABS 1.4  --   MONOABS 0.6  --   EOSABS 0.1  --   BASOSABS 0.0  --     Chemistries   Recent Labs Lab 10/26/14 1430 10/27/14 0545  NA 138 140  K 4.7 4.6  CL 109 109  CO2 20* 25  GLUCOSE 150* 112*  BUN 20 22*  CREATININE 1.88* 1.87*  CALCIUM 8.9 8.6*  AST 29  --   ALT 14*  --   ALKPHOS 75  --   BILITOT 0.8  --    ------------------------------------------------------------------------------------------------------------------ estimated creatinine clearance is 25.8 mL/min (by C-G formula based on Cr of 1.87). ------------------------------------------------------------------------------------------------------------------ No results for input(s): HGBA1C in the last 72 hours. ------------------------------------------------------------------------------------------------------------------ No results for input(s): CHOL, HDL, LDLCALC, TRIG, CHOLHDL, LDLDIRECT in the last 72 hours. ------------------------------------------------------------------------------------------------------------------  Recent Labs  10/27/14 0545  TSH 2.063   ------------------------------------------------------------------------------------------------------------------ No results for input(s): VITAMINB12, FOLATE, FERRITIN, TIBC, IRON, RETICCTPCT in the last 72 hours.  Coagulation profile No results for input(s): INR, PROTIME in the last 168 hours.  No results for input(s): DDIMER in the last 72 hours.  Cardiac Enzymes No results for input(s): CKMB, TROPONINI, MYOGLOBIN in the last 168 hours.  Invalid input(s):  CK ------------------------------------------------------------------------------------------------------------------ Invalid input(s): [place]  10/26/14 2304 10/27/14 0442 10/27/14 0613 10/27/14 1111  GLUCAP 177* 24* 93 73     Americus Scheurich M.D. [facility] 10/27/2014, 11:30 AM  Pager: [phone] Between 7am to 7pm - call Pager - [phone]  After 7pm go to [email or web address] - password [email or web address]  Call night coverage person covering after 7pm

## 2014-10-28 LAB — GLUCOSE, CAPILLARY
GLUCOSE-CAPILLARY: 144 mg/dL — AB (ref 65–99)
Glucose-Capillary: 144 mg/dL — ABNORMAL HIGH (ref 65–99)
Glucose-Capillary: 236 mg/dL — ABNORMAL HIGH (ref 65–99)
Glucose-Capillary: 230 mg/dL — ABNORMAL HIGH (ref 65–99)

## 2014-10-28 MED ORDER — INSULIN NPH (HUMAN) (ISOPHANE) 100 UNIT/ML ~~LOC~~ SUSP
30.0000 [IU] | Freq: Every day | SUBCUTANEOUS | Status: DC
  Administered 2014-10-29: 30 [IU] via SUBCUTANEOUS
  Filled 2014-10-28: qty 10

## 2014-10-28 MED ORDER — INSULIN NPH (HUMAN) (ISOPHANE) 100 UNIT/ML ~~LOC~~ SUSP
30.0000 [IU] | Freq: Every day | SUBCUTANEOUS | Status: DC
  Administered 2014-10-28: 30 [IU] via SUBCUTANEOUS

## 2014-10-28 NOTE — Clinical Social Work Note (Addendum)
Clinical Social Work Assessment  Patient Details  Name: Alexis Lopez MRN: [id number] Date of Birth: 08/16/1920  Date of referral:  10/27/14               Reason for consult:  Facility Placement                Permission sought to share information with:  Family Supports Permission granted to share information::  Yes, Verbal Permission Granted  Name::     Alexis Lopez patient's HCPOA and daughter Alexis Lopez::  SNF admissions  Relationship::     Contact Information:     Housing/Transportation Living arrangements for the past 2 months:  Single Family Home Source of Information:  Patient, Spouse, Adult Children Patient Interpreter Needed:  None Criminal Activity/Legal Involvement Pertinent to Current Situation/Hospitalization:  No - Comment as needed Significant Relationships:  Adult Children, Spouse Lives with:  Spouse Do you feel safe going back to the place where you live?  Yes (Patient would like to return back home once he has received some short term rehab.) Need for family participation in patient care:  Yes (Comment) (Patient requests family to be involved with decision making.)  Care giving concerns: Patient, his family, and his spouse feel patient would benefit from some short term rehab before returning back home.  Social Worker assessment / plan:  Patient is a pleasant alert and oriented 81 year old male who lives with his wife.  Patient requests that CSW speak with his daughters to discuss progress.  Patient has strong family support and are at his bedside, and patient is able to converse regularly.  Patient states he was at rehab a long time ago maybe 2 or 3 years ago, but was not familiar with the discharge process for a patient going to SNF.  Patient's daughter Alexis Lopez stated she is not the HCPOA, but is available to help with discharge planning.  Patient's Alexis Lopez is his other daughter Alexis Lopez, her contact info is [phone].  Patient's family had questions about observation  status vs inpatient status, CSW notified case manager, who will follow up with patient's family and explain the differences.  Patient and his family were explained the SNF placement process and stated they are looking forward to discharging to SNF for short term rehab.  Employment status:  Retired Health visitor, [employer] PT Recommendations:  [place] / Referral to community resources:  [place]  Patient/Family's Response to care:  Patient's family are in agreement to going to SNF for short term rehab  Patient/Family's Understanding of and Emotional Response to Diagnosis, Current Treatment, and Prognosis:  Patient's family are aware of patient's poor prognosis but fel lllllllllll  Emotional Assessment Appearance:  Appears younger than stated age Attitude/Demeanor/Rapport:    Affect (typically observed):  Happy, Hopeful, Calm, Stable Orientation:  Oriented to Self, Oriented to Place, Oriented to  Time, Oriented to Situation Alcohol / Substance use:  Not Applicable Psych involvement (Current and /or in the community):  No (Comment)  Discharge Needs  Concerns to be addressed:  Lack of Support Readmission within the last 30 days:  No Current discharge risk:  None Barriers to Discharge:  No Barriers Identified   Alexis Lopez, LCSWA 10/28/2014, 12:35 AM

## 2014-10-28 NOTE — Clinical Social Work Note (Signed)
CSW met with patient and his family to discuss SNF placement process and how insurances pays for SNF.  CSW spoke to the family and gave them a list of SNFs to look at and to start thinking about placement.  CSW discussed with family about the role of Education officer, museum.  Patient and family agreed to go to SNF for short term rehab and the plan is to return home once patient has been in SNF for rehab first.  Alexis Lopez. [place], MSW, [place]

## 2014-10-28 NOTE — Progress Notes (Signed)
Bed offers given to pt daughter- choice pending.  CSW will continue to follow- fl2 on chart to be signed.  Domenica Reamer, [place] Social Worker [phone]

## 2014-10-28 NOTE — Progress Notes (Signed)
[facility]                                                                              Patient Demographics  Alexis Lopez, is a 81 y.o. male, DOB - June 13, 1920, OIT:[id number]  Admit date - 10/26/2014   Admitting Physician Rise Patience, MD  Outpatient Primary MD for the patient is Irven Shelling, MD  LOS -    Chief Complaint  Patient presents with  . Weakness       Brief HPI   Alexis Lopez is a 81 y.o. male with history of diabetes mellitus, hypertension, chronic kidney disease and dementia was referred to the ER after patient was found to have increasing difficulty walking. Patient stated he had fell last week after missing steps. Denied hitting his head or losing consciousness. Since his fall was noted that patient was having increasing difficulty walking and low back pain on trying to walk. Patient denied any incontinence of urine or bowel. Patient had gone to visit his PCP when he was found to have difficulty walking and increased pain on trying to walk in his low back. And was referred to the ER. On exam patient does not have any hyperreflexia of the lower extremity. At rest patient did not have any pain in his low back. Patient was admitted for further workup.   Assessment & Plan    Principal Problem:   Low back pain with difficulty ambulating, weakness- improving significantly - MRI of the lumbar spine showed multifactorial degenerative changes with resultant severe canal stenosis and cord compression at the level of T11 and 12, severe canal stenosis at L4 on L5 with disc bulge. Intra-abdominal aortic aneurysm up to 3.6 cm. - Dr. Hal Hope, admitting physician discussed with on-call neurosurgery, Dr. Hal Neer who reviewed patient's MRI and felt that given the patient's comorbidities, will not be an ideal surgical candidate.  - Continue IV Decadron for 3 days and then slowly taper off on cholesterol meds, PT consult, nursing facility  rehabilitation.  Active Problems:   Essential hypertension - Currently stable, continue home medications, PRN hydralazine    Chronic kidney disease, stage III (moderate) -Currently stable, follow BMET     Sinus bradycardia - TSH normal, 2.06. hold off on Aricept    Diabetes mellitus type 2, controlled - Follow blood sugars closely, on Decadron - continue NPH insulin, increase bedtime to 30 units and sliding scale insulin correction factor    Chronic anemia,  Thrombocytopenia - Follow CBC   Code Status: full  Family Communication: Discussed in detail with the patient, all imaging results, lab results explained to the patient   Disposition Plan: not medically ready  Time Spent in minutes  25 minutes  Procedures  MRI lumbar spine   Consults  Neurosurgery  DVT Prophylaxis  SCD's  Medications  Scheduled Meds: . amLODipine  10 mg Oral Daily  . aspirin EC  81 mg Oral Daily  . brimonidine  1-2 drop Both Eyes TID  . calcitRIOL  0.25 mcg Oral Daily  . dexamethasone  4 mg Intravenous 4 times per day  . doxazosin  8 mg Oral Daily  .  enoxaparin (LOVENOX) injection  30 mg Subcutaneous QHS  . insulin aspart  0-9 Units Subcutaneous TID WC  . insulin NPH Human  30 Units Subcutaneous QAC breakfast   And  . insulin NPH Human  26 Units Subcutaneous QHS  . pravastatin  40 mg Oral q1800  . sodium chloride  3 mL Intravenous Q12H  . tamsulosin  0.4 mg Oral Daily  . timolol  1-2 drop Both Eyes BID   Continuous Infusions:  PRN Meds:.acetaminophen **OR** acetaminophen, hydrALAZINE, HYDROcodone-acetaminophen, ondansetron **OR** ondansetron (ZOFRAN) IV   Antibiotics   Anti-infectives    None        Subjective:   Alexis Lopez was seen and examined today. Looks much better today, sitting at the edge of the bed, back pain has significantly improved.  Patient denies dizziness, chest pain, shortness of breath, abdominal pain, N/V/D/C. No acute events overnight.    Objective:    Blood pressure 136/59, pulse 71, temperature 97.8 F (36.6 C), temperature source Oral, resp. rate 18, height 5\' 7"  (1.702 m), weight 81.285 kg (179 lb 3.2 oz), SpO2 96 %.  Wt Readings from Last 3 Encounters:  10/28/14 81.285 kg (179 lb 3.2 oz)  08/27/14 82.464 kg (181 lb 12.8 oz)  07/15/14 83.643 kg (184 lb 6.4 oz)     Intake/Output Summary (Last 24 hours) at 10/28/14 1244 Last data filed at 10/28/14 0910  Gross per 24 hour  Intake    240 ml  Output   1110 ml  Net   -870 ml    Exam  General: Alert and oriented x 2, NAD  HEENT:  PERRLA, EOMI, Anicteric Sclera, mucous membranes moist.   Neck: Supple, no JVD, no masses  CVS: S1 S2 auscultated, no rubs, murmurs or gallops. Regular rate and rhythm.  Respiratory: Clear to auscultation bilaterally, no wheezing, rales or rhonchi  Abdomen: Soft, nontender, nondistended, + bowel sounds  Ext: no cyanosis clubbing or edema  Neuro: AAOx2, moving all extremities  Psych: Normal affect and demeanor, alert and oriented x2    Data Review   Micro Results No results found for this or any previous visit (from the past 240 hour(s)).  Radiology Reports Dg Lumbar Spine Complete  10/26/2014   CLINICAL DATA:  Lumbago for 2 weeks.  Patient fell 1 month prior  EXAM: LUMBAR SPINE - COMPLETE 4+ VIEW  COMPARISON:  None.  FINDINGS: Frontal, lateral, spot lumbosacral lateral, and bilateral oblique views were obtained. There are 5 non-rib-bearing lumbar type vertebral bodies. There is no fracture or spondylolisthesis. There is moderate disc Lopez narrowing at L4-5 and L5-S1. There is also disc Lopez narrowing in the visualized lower thoracic levels.  There are osteophytes and syndesmophytes in the lumbar spine. No sacroiliitis appreciable. There is facet osteoarthritic change at all levels bilaterally.  IMPRESSION: Multilevel osteoarthritic change. There also syndesmotic fights suggesting superimposed seronegative spondyloarthropathy. No  sacroiliitis. No fracture or spondylolisthesis.   Electronically Signed   By: Lowella Grip III M.D.   On: 10/26/2014 18:17   Ct Head Wo Contrast  10/26/2014   CLINICAL DATA:  Generalized weakness. Difficulty walking. Fall several days ago.  EXAM: CT HEAD WITHOUT CONTRAST  TECHNIQUE: Contiguous axial images were obtained from the base of the skull through the vertex without intravenous contrast.  COMPARISON:  04/11/2012.  FINDINGS: No mass lesion, mass effect, midline shift, hydrocephalus, hemorrhage. No acute territorial cortical ischemia/infarct. Atrophy and chronic ischemic white matter disease is present. Dystrophic calcification is present at the a atrium of the  LEFT lateral ventricle. This is a chronic finding. Severe RIGHT temporomandibular joint osteoarthritis. RIGHT lens extraction.  IMPRESSION: Atrophy and chronic ischemic white matter disease without acute intracranial abnormality.   Electronically Signed   By: Dereck Ligas M.D.   On: 10/26/2014 17:52   Mr Lumbar Spine Wo Contrast  10/26/2014   CLINICAL DATA:  Initial evaluation for acute weakness, low back pain, difficulty with ambulation.  EXAM: MRI LUMBAR SPINE WITHOUT CONTRAST  TECHNIQUE: Multiplanar, multisequence MR imaging of the lumbar spine was performed. No intravenous contrast was administered.  COMPARISON:  Prior radiograph from earlier the same day.  FINDINGS: For the purposes of this dictation, the lowest well-formed intervertebral disc spaces presumed to be the L5-S1 level, and there presumed to be 5 lumbar type vertebral bodies. There is transitional lumbosacral anatomy with partial sacralization of the L5 vertebral body. The L5-S1 disc is rudimentary.  There is 4 mm anterolisthesis of L4 on L5. Otherwise, vertebral bodies are normally aligned with preservation of the normal lumbar lordosis.  There is anterior and mid height loss along the right aspect of the superior endplate of L38. Height loss measures up to 80% of height  loss. This appears late subacute to chronic in nature. Prominent degenerative endplate changes present within the adjacent inferior endplate of B01 with associated degenerative Schmorl's node. Prominent anterior osteophytic spurring present at this level. Otherwise, vertebral body heights are preserved.  Conus medullaris terminates normally at the L1-2 level. Signal intensity within the visualized cord is normal.  No acute paraspinous soft tissue abnormality. Aneurysmal dilatation of the intra-abdominal aorta at to 3.7 cm. Scattered cyst noted within the partially visualized kidneys bilaterally.  T10-11: Seen only on sagittal projection. Degenerative intervertebral disc Lopez narrowing with intervertebral disc Lopez height loss and mild diffuse disc bulge. Mild canal stenosis. Foramina are grossly patent.  T11-12: Height loss along the right aspect of the superior endplate of B51 of up to 80%. No significant bony retropulsion. Degenerative endplate changes at the inferior endplate of W25 with associated Schmorl's node. Prominent bulky anterior osteophytic spurring, abutting the posterior aspect of the aorta. There is superimposed diffuse disc bulging at this level. Severe bilateral facet arthrosis. There is a superimposed synovial cyst arising from the anteromedial aspect of the left T11-12 facet that measures 0.5 x 0.7 x 1.0 cm (series 500, image 5). There is resultant severe canal stenosis at this level with compression of the distal spinal cord. Thecal sac measures 4 mm at its most narrow point. No associated cord signal changes. Severe bilateral foraminal narrowing present as well.  T12-L1: No disc bulge or disc protrusion. Mild disc desiccation. Mild bilateral facet hypertrophy. No significant stenosis.  L1-2: No significant disc bulge or disc protrusion. Mild disc desiccation. Mild facet hypertrophy. No significant canal or foraminal stenosis.  L2-3: Small left foraminal disc protrusion without associated  neural impingement (series 500, image 24). Facet and ligamentous hypertrophy. Resultant mild canal and lateral recess stenosis. Foramina remain widely patent.  L3-4: Mild diffuse annular disc bulge without focal disc herniation. Predominately left-sided endplate osteophytic spurring. Mild facet and ligamentous hypertrophy. No significant canal or foraminal stenosis.  L4-5: 4 mm anterolisthesis of L4 on L5. There is associated diffuse disc bulge with severe bilateral facet arthrosis and ligamentum flavum hypertrophy. There is resultant severe canal stenosis with the thecal sac measuring approximately 6 mm in AP diameter at its most narrow point. Epidural lipomatosis within the ventral epidural Lopez posterior to the L4 vertebral body. Mild bilateral foraminal  narrowing present as well.  L5-S1: Somewhat rudimentary L5-S1 disc. Prominent degenerative endplate changes with mild diffuse disc bulge. Mild bilateral facet hypertrophy. No significant canal stenosis. Mild right foraminal narrowing.  IMPRESSION: 1. Multifactorial degenerative changes with resultant severe canal stenosis and cord compression at the level of T11-12 as detailed above. No associated cord signal changes. 2. 4 mm anterolisthesis of L4 on L5 with associated disc bulge and severe facet arthrosis, resulting in severe canal stenosis at this level. 3. Approximately 80% of height loss at the right aspect of the superior endplate of E26, likely late subacute to chronic in nature. Vertebral body heights otherwise preserved. 4. Additional more mild multilevel degenerative changes as detailed above. 5. Transitional lumbosacral anatomy with partial sacralization of the L5 vertebral body. Careful correlation with the numbering system on this exam and prior plain film radiographs recommended prior to any potential future surgical intervention. 6. Intra-abdominal aortic aneurysm measuring up to 3.6 cm in diameter. Critical Value/emergent results were called by  telephone at the time of interpretation on 10/26/2014 at 11:25 pm to Dr. Gean Birchwood , who verbally acknowledged these results.   Electronically Signed   By: Jeannine Boga M.D.   On: 10/26/2014 23:28    CBC  Recent Labs Lab 10/26/14 1430 10/27/14 0545  WBC 5.1 5.5  HGB 13.0 12.0*  HCT 38.2* 36.0*  PLT 124* 120*  MCV 89.0 90.7  MCH 30.3 30.2  MCHC 34.0 33.3  RDW 13.3 13.3  LYMPHSABS 1.4  --   MONOABS 0.6  --   EOSABS 0.1  --   BASOSABS 0.0  --     Chemistries   Recent Labs Lab 10/26/14 1430 10/27/14 0545  NA 138 140  K 4.7 4.6  CL 109 109  CO2 20* 25  GLUCOSE 150* 112*  BUN 20 22*  CREATININE 1.88* 1.87*  CALCIUM 8.9 8.6*  AST 29  --   ALT 14*  --   ALKPHOS 75  --   BILITOT 0.8  --    ------------------------------------------------------------------------------------------------------------------ estimated creatinine clearance is 26.3 mL/min (by C-G formula based on Cr of 1.87). ------------------------------------------------------------------------------------------------------------------ No results for input(s): HGBA1C in the last 72 hours. ------------------------------------------------------------------------------------------------------------------ No results for input(s): CHOL, HDL, LDLCALC, TRIG, CHOLHDL, LDLDIRECT in the last 72 hours. ------------------------------------------------------------------------------------------------------------------  Recent Labs  10/27/14 0545  TSH 2.063   ------------------------------------------------------------------------------------------------------------------ No results for input(s): VITAMINB12, FOLATE, FERRITIN, TIBC, IRON, RETICCTPCT in the last 72 hours.  Coagulation profile No results for input(s): INR, PROTIME in the last 168 hours.  No results for input(s): DDIMER in the last 72 hours.  Cardiac Enzymes No results for input(s): CKMB, TROPONINI, MYOGLOBIN in the last 168  hours.  Invalid input(s): CK ------------------------------------------------------------------------------------------------------------------ Invalid input(s): [place]  10/27/14 0613 10/27/14 1111 10/27/14 1643 10/27/14 2048 10/28/14 0617 10/28/14 1120  GLUCAP 93 85 131* 213* 144* 144*     RAI,RIPUDEEP M.D. [facility] 10/28/2014, 12:44 PM  Pager: [phone] Between 7am to 7pm - call Pager - [phone]  After 7pm go to [email or web address] - password [email or web address]  Call night coverage person covering after 7pm

## 2014-10-28 NOTE — Clinical Social Work Placement (Signed)
   CLINICAL SOCIAL WORK PLACEMENT  NOTE  Date:  10/28/2014  Patient Details  Name: Emori Kamau [facility] MRN: [id number] Date of Birth: Apr 16, 1920  Clinical Social Work is seeking post-discharge placement for this patient at the [place] level of care (*CSW will initial, date and re-position this form in  chart as items are completed):  Yes   Patient/family provided with [place] Work Department's list of facilities offering this level of care within the geographic area requested by the patient (or if unable, by the patient's family).  Yes   Patient/family informed of their freedom to choose among providers that offer the needed level of care, that participate in Medicare, Medicaid or managed care program needed by the patient, have an available bed and are willing to accept the patient.  Yes   Patient/family informed of [facility]'s ownership interest in [facility] and [facility], as well as of the fact that they are under no obligation to receive care at these facilities.  PASRR submitted to EDS on 10/27/14     PASRR number received on 10/27/14     Existing PASRR number confirmed on       FL2 transmitted to all facilities in geographic area requested by pt/family on       FL2 transmitted to all facilities within larger geographic area on 10/27/14     Patient informed that his/her managed care company has contracts with or will negotiate with certain facilities, including the following:            Patient/family informed of bed offers received.  Patient chooses bed at       Physician recommends and patient chooses bed at      Patient to be transferred to   on  .  Patient to be transferred to facility by       Patient family notified on   of transfer.  Name of family member notified:        PHYSICIAN       Additional Comment:    _______________________________________________ [name], LCSWA 10/27/14 4:00 pm

## 2014-10-29 LAB — GLUCOSE, CAPILLARY
Glucose-Capillary: 183 mg/dL — ABNORMAL HIGH (ref 65–99)
Glucose-Capillary: 122 mg/dL — ABNORMAL HIGH (ref 65–99)

## 2014-10-29 MED ORDER — INSULIN NPH (HUMAN) (ISOPHANE) 100 UNIT/ML ~~LOC~~ SUSP: 30.0000 [IU] | Freq: Two times a day (BID) | SUBCUTANEOUS | Status: DC

## 2014-10-29 MED ORDER — DEXAMETHASONE 2 MG PO TABS: 2.0000 mg | ORAL_TABLET | Freq: Three times a day (TID) | ORAL | Status: DC

## 2014-10-29 MED ORDER — DEXAMETHASONE 2 MG PO TABS
2.0000 mg | ORAL_TABLET | Freq: Three times a day (TID) | ORAL | Status: DC
  Administered 2014-10-29: 2 mg via ORAL
  Filled 2014-10-29 (×4): qty 1

## 2014-10-29 MED ORDER — HYDROCODONE-ACETAMINOPHEN 5-325 MG PO TABS: 1.0000 | ORAL_TABLET | ORAL | Status: DC | PRN

## 2014-10-29 NOTE — Progress Notes (Signed)
Patient will discharge to [facility]  Anticipated discharge date: 10/28/13 Family notified:dtr at bedside Transportation by [facility]AR- scheduled at [place] signing off.  Domenica Reamer, [place] Social Worker [phone]

## 2014-10-29 NOTE — Progress Notes (Addendum)
Physical Therapy Treatment Patient Details Name: Alexis Lopez [facility] MRN: [id number] DOB: 1920-04-24 Today's Date: 10/29/2014    History of Present Illness Pt is a 81 y/o male with a PMH of DM, HTN, CKD, dementia. Pt presents to the ED after a fall last week and progressive weakness/difficulty ambulating with increased LBP.      PT Comments    Pt progressing towards physical therapy goals. Prior to session, discussed with pt, pt's daughter, and MD (at the request of family) the possibility of CIR at d/c. Recommended a CIR screen at end of session.   During session, pt with decreased safety awareness, and increased need for hands-on assist for balance and support during mobility. Pt was able to ambulate ~225 feet (with 2 standing rest breaks), however at end of gait training pt had increased difficulty clearing the floor with his feet and was mainly shuffling forward with each step. Overall, pt is motivated and willing to work with therapy. Do not feel that pt is safe to be at home alone at this time, and the patient will benefit from a higher frequency of therapy available at SNF vs. CIR. Family is motivated to try for CIR. Will continue to follow.   Follow Up Recommendations  SNF;Supervision/Assistance - 24 hour vs. CIR     Equipment Recommendations  Rolling walker with 5" wheels;3in1 (PT)    Recommendations for Other Services       Precautions / Restrictions Precautions Precautions: Fall;Back Precaution Comments: Back precautions for comfort Restrictions Weight Bearing Restrictions: No    Mobility  Bed Mobility               General bed mobility comments: Pt sitting up in recliner upon PT arrival  Transfers Overall transfer level: Needs assistance Equipment used: Rolling walker (2 wheeled);None Transfers: Sit to/from Stand Sit to Stand: Min guard;Mod assist         General transfer comment: Pt initially attempted to stand at edge of chair with no AD. Had a LOB almost  immediately and required mod assist with gait belt to safely recover and return to sitting. RW was placed in front of pt and close guard for safety was provided as pt powered-up to full stand and grabbed the RW.   Ambulation/Gait Ambulation/Gait assistance: Min assist Ambulation Distance (Feet): 225 Feet Assistive device: Rolling walker (2 wheeled) Gait Pattern/deviations: Decreased stride length;Shuffle;Trunk flexed;Decreased dorsiflexion - right;Decreased dorsiflexion - left Gait velocity: Decreased Gait velocity interpretation: Below normal speed for age/gender General Gait Details: Pt was motivated to improve distance this session, however fatigued quickly and required 2 standing rest breaks throughout gait training. Pt was frequently cued for proper hand placement on walker, walker positioning close to pt's body, and improved posture. Appeared able to achieve a more upright posture this session however continues to be grossly flexed due to pain.    Stairs            Wheelchair Mobility    Modified Rankin (Stroke Patients Only)       Balance Overall balance assessment: Needs assistance Sitting-balance support: Feet supported;No upper extremity supported Sitting balance-Leahy Scale: Fair     Standing balance support: No upper extremity supported;During functional activity Standing balance-Leahy Scale: Poor Standing balance comment: Pt leaning onto forearms at sink for support while washing hands                    Cognition Arousal/Alertness: Awake/alert Behavior During Therapy: WFL for tasks assessed/performed Overall Cognitive Status: History  of cognitive impairments - at baseline (PMH of dementia. Appeared mostly WFL)       Memory: Decreased short-term memory              Exercises General Exercises - Lower Extremity Long Arc Quad: 10 reps Hip ABduction/ADduction: 10 reps (Abd AROM, Add isometric)    General Comments General comments (skin  integrity, edema, etc.): Generally poor safety awareness and requires frequent cues for proper sequencing, especially during turns and managing in the bathroom.       Pertinent Vitals/Pain Pain Assessment: Faces Faces Pain Scale: Hurts a little bit Pain Location: mid-low back with mobility Pain Descriptors / Indicators: Aching;Grimacing Pain Intervention(s): Limited activity within patient's tolerance;Monitored during session;Repositioned    Home Living                      Prior Function            PT Goals (current goals can now be found in the care plan section) Acute Rehab PT Goals Patient Stated Goal: Return home today PT Goal Formulation: With patient/family Time For Goal Achievement: 11/03/14 Potential to Achieve Goals: Good Progress towards PT goals: Progressing toward goals    Frequency  Min 3X/week    PT Plan Current plan remains appropriate    Co-evaluation             End of Session Equipment Utilized During Treatment: Gait belt Activity Tolerance: Patient limited by fatigue Patient left: in chair;with call bell/phone within reach     Time: 0950-1020 PT Time Calculation (min) (ACUTE ONLY): 30 min  Charges:  $Gait Training: 8-22 mins $Therapeutic Activity: 8-22 mins                    G Codes:      Rolinda Roan 2014-11-28, 11:05 AM   Rolinda Roan, PT, DPT Acute Rehabilitation Services Pager: [phone]

## 2014-10-29 NOTE — Care Management Important Message (Signed)
Important Message  Patient Details  Name: Alexis Lopez MRN: [id number] Date of Birth: [birth year]   Medicare Important Message Given:  N/A - LOS <3 / Initial given by admissions    Dawayne Patricia, RN 10/29/2014, 1:57 PM

## 2014-10-29 NOTE — Progress Notes (Signed)
Rehab admissions - I received a call from [place], social worker informing me that rehab consult had just been ordered at 10:40 am today but that pt is likely medically ready for discharge today to skilled nursing. Eliezer Lofts asked that I speak with pt/family about the possibility of CIR consult. I spoke with pt's dtr by phone to share that if pt is medically cleared for DC, then rehab consult would not be completed. I also shared with dtr that I was not sure if his [facility] insurance would give authorization based on his current diagnosis. I explained to dtr that [place], Education officer, museum will continue to work on discharge planning.  I then reviewed pt's case with Olin Hauser, rehab PA. Rehab PA is recommending skilled nursing. See her note for further details.   I updated [place], Education officer, museum and Primary school teacher, Tourist information centre manager. Per Eliezer Lofts, dtr is now agreeable to [facility] and the plan is for discharge today.  Thanks.  Nanetta Batty, PT Rehabilitation Admissions Coordinator [phone]

## 2014-10-29 NOTE — Progress Notes (Signed)
Thank you for consult on Alexis Lopez. He was admitted with pain and difficulty ambulating after a fall week prior to admission. MRI shows degenerative changes resulting in chronic cord compression.   He is making progress and ambulating 225' with min assist but limited by poor posture due to pain.  Would recommend SNF level therapies after discharge.

## 2014-10-29 NOTE — Discharge Summary (Signed)
Physician Discharge Summary   Patient ID: Alexis Lopez [facility] MRN: [id number] DOB/AGE: January 15, 1921 81 y.o.  Admit date: 10/26/2014 Discharge date: 10/29/2014  Primary Care Physician:  Irven Shelling, MD  Discharge Diagnoses:   . Low back pain   severe canal stenosis and cord compression at the level of T11 and 12 . Sinus bradycardia . Chronic kidney disease, stage III (moderate) . Essential hypertension . Chronic anemia . Thrombocytopenia   Consults: Dr. Hal Neer, neurosurgery   Recommendations for Outpatient Follow-up:  Patient has been started on oral Decadron 2 mg every 8 hours, will need to be tapered very slowly. Please make an appointment with Dr. Hal Neer In 2 weeks.   TESTS THAT NEED FOLLOW-UP BMET, CBC   DIET: , Modify diet   Allergies:  No Known Allergies   Discharge Medications:   Medication List    STOP taking these medications        insulin regular 100 units/mL injection  Commonly known as:  NOVOLIN R,HUMULIN R      TAKE these medications        ACCU-CHEK FASTCLIX LANCETS Misc  Use to check blood sugars 2 times per day     amLODipine 10 MG tablet  Commonly known as:  NORVASC  Take 10 mg by mouth daily.     aspirin 81 MG tablet  Take 81 mg by mouth daily.     brimonidine 0.15 % ophthalmic solution  Commonly known as:  ALPHAGAN  Place 1-2 drops into both eyes 2 (two) times daily.     calcitRIOL 0.25 MCG capsule  Commonly known as:  ROCALTROL  Take 1 capsule (0.25 mcg total) by mouth daily.     dexamethasone 2 MG tablet  Commonly known as:  DECADRON  Take 1 tablet (2 mg total) by mouth 3 (three) times daily.     donepezil 10 MG tablet  Commonly known as:  ARICEPT  Take 10 mg by mouth at bedtime.     doxazosin 8 MG tablet  Commonly known as:  CARDURA  Take 8 mg by mouth daily.     glucose blood test strip  Commonly known as:  ACCU-CHEK SMARTVIEW  Use as instructed to check blood sugars 2 times per day, dx code 250.00      HYDROcodone-acetaminophen 5-325 MG per tablet  Commonly known as:  NORCO/VICODIN  Take 1 tablet by mouth every 4 (four) hours as needed for moderate pain or severe pain.     insulin NPH Human 100 UNIT/ML injection  Commonly known as:  HUMULIN N,NOVOLIN N  Inject 0.3 mLs (30 Units total) into the skin 2 (two) times daily before a meal.     pravastatin 40 MG tablet  Commonly known as:  PRAVACHOL  Take 40 mg by mouth daily.     tamsulosin 0.4 MG Caps capsule  Commonly known as:  FLOMAX  Take 0.4 mg by mouth daily.     timolol 0.5 % ophthalmic solution  Commonly known as:  TIMOPTIC  Place 1-2 drops into both eyes 2 (two) times daily.         Brief H and P: For complete details please refer to admission H and P, but in brief  Alexis Lopez is a 81 y.o. male with history of diabetes mellitus, hypertension, chronic kidney disease and dementia was referred to the ER after patient was found to have increasing difficulty walking. Patient stated he had fell last week after missing steps. Denied hitting his head or losing consciousness.  Since his fall was noted that patient was having increasing difficulty walking and low back pain on trying to walk. Patient denied any incontinence of urine or bowel. Patient had gone to visit his PCP when he was found to have difficulty walking and increased pain on trying to walk in his low back. And was referred to the ER. On exam patient does not have any hyperreflexia of the lower extremity. At rest patient did not have any pain in his low back. Patient was admitted for further workup.   Hospital Course:  Low back pain with difficulty ambulating, weakness- improving significantly - MRI of the lumbar spine showed multifactorial degenerative changes with resultant severe canal stenosis and cord compression at the level of T11 and 12, severe canal stenosis at L4 on L5 with disc bulge. Intra-abdominal aortic aneurysm up to 3.6 cm. - Dr. Hal Hope, admitting  physician discussed with on-call neurosurgery, Dr. Hal Neer who reviewed patient's MRI and felt that given the patient's comorbidities, will not be an ideal surgical candidate.  - Patient was recommended to continue IV Decadron for 3 days, patient has completed IV Decadron, now transitioned to oral Decadron 2 mg 3 times a day and then slowly taper off. Physical therapy consult was obtained and recommended skilled nursing facility for rehabilitation. Patient was also assessed by inpatient rehabilitation and also recommended skilled nursing facility.    Essential hypertension - Currently stable, continue home medications   Chronic kidney disease, stage III (moderate) -Baseline creatinine appears to be 1.9, currently 1.8 at the time of discharge.    Sinus bradycardia - TSH normal, 2.06, Aricept was discontinued.   Diabetes mellitus type 2, controlled - Follow blood sugars closely, on Decadron - continue NPH insulin, continue 30 units BID    Chronic anemia, Thrombocytopenia - Remained stable.  Day of Discharge BP 167/54 mmHg  Pulse 59  Temp(Src) 97.7 F (36.5 C) (Oral)  Resp 18  Ht 5\' 7"  (1.702 m)  Wt 81.1 kg (178 lb 12.7 oz)  BMI 28.00 kg/m2  SpO2 98%  Physical Exam: General: Alert and awake oriented x3 not in any acute distress. HEENT: anicteric sclera, pupils reactive to light and accommodation CVS: S1-S2 clear no murmur rubs or gallops Chest: clear to auscultation bilaterally, no wheezing rales or rhonchi Abdomen: soft nontender, nondistended, normal bowel sounds Extremities: no cyanosis, clubbing or edema noted bilaterally Neuro: Cranial nerves II-XII intact, no focal neurological deficits   The results of significant diagnostics from this hospitalization (including imaging, microbiology, ancillary and laboratory) are listed below for reference.    LAB RESULTS: Basic Metabolic Panel:  Recent Labs Lab 10/26/14 1430 10/27/14 0545  NA 138 140  K 4.7 4.6  CL  109 109  CO2 20* 25  GLUCOSE 150* 112*  BUN 20 22*  CREATININE 1.88* 1.87*  CALCIUM 8.9 8.6*   Liver Function Tests:  Recent Labs Lab 10/26/14 1430  AST 29  ALT 14*  ALKPHOS 75  BILITOT 0.8  PROT 6.8  ALBUMIN 3.6   No results for input(s): LIPASE, AMYLASE in the last 168 hours. No results for input(s): AMMONIA in the last 168 hours. CBC:  Recent Labs Lab 10/26/14 1430 10/27/14 0545  WBC 5.1 5.5  NEUTROABS 3.0  --   HGB 13.0 12.0*  HCT 38.2* 36.0*  MCV 89.0 90.7  PLT 124* 120*   Cardiac Enzymes: No results for input(s): CKTOTAL, CKMB, CKMBINDEX, TROPONINI in the last 168 hours. BNP: Invalid input(s): POCBNP CBG:  Recent Labs Lab 10/29/14 0555  10/29/14 1108  GLUCAP 183* 122*    Significant Diagnostic Studies:  Dg Lumbar Spine Complete  10/26/2014   CLINICAL DATA:  Lumbago for 2 weeks.  Patient fell 1 month prior  EXAM: LUMBAR SPINE - COMPLETE 4+ VIEW  COMPARISON:  None.  FINDINGS: Frontal, lateral, spot lumbosacral lateral, and bilateral oblique views were obtained. There are 5 non-rib-bearing lumbar type vertebral bodies. There is no fracture or spondylolisthesis. There is moderate disc space narrowing at L4-5 and L5-S1. There is also disc space narrowing in the visualized lower thoracic levels.  There are osteophytes and syndesmophytes in the lumbar spine. No sacroiliitis appreciable. There is facet osteoarthritic change at all levels bilaterally.  IMPRESSION: Multilevel osteoarthritic change. There also syndesmotic fights suggesting superimposed seronegative spondyloarthropathy. No sacroiliitis. No fracture or spondylolisthesis.   Electronically Signed   By: Lowella Grip III M.D.   On: 10/26/2014 18:17   Ct Head Wo Contrast  10/26/2014   CLINICAL DATA:  Generalized weakness. Difficulty walking. Fall several days ago.  EXAM: CT HEAD WITHOUT CONTRAST  TECHNIQUE: Contiguous axial images were obtained from the base of the skull through the vertex without  intravenous contrast.  COMPARISON:  04/11/2012.  FINDINGS: No mass lesion, mass effect, midline shift, hydrocephalus, hemorrhage. No acute territorial cortical ischemia/infarct. Atrophy and chronic ischemic white matter disease is present. Dystrophic calcification is present at the a atrium of the LEFT lateral ventricle. This is a chronic finding. Severe RIGHT temporomandibular joint osteoarthritis. RIGHT lens extraction.  IMPRESSION: Atrophy and chronic ischemic white matter disease without acute intracranial abnormality.   Electronically Signed   By: Dereck Ligas M.D.   On: 10/26/2014 17:52   Mr Lumbar Spine Wo Contrast  10/26/2014   CLINICAL DATA:  Initial evaluation for acute weakness, low back pain, difficulty with ambulation.  EXAM: MRI LUMBAR SPINE WITHOUT CONTRAST  TECHNIQUE: Multiplanar, multisequence MR imaging of the lumbar spine was performed. No intravenous contrast was administered.  COMPARISON:  Prior radiograph from earlier the same day.  FINDINGS: For the purposes of this dictation, the lowest well-formed intervertebral disc spaces presumed to be the L5-S1 level, and there presumed to be 5 lumbar type vertebral bodies. There is transitional lumbosacral anatomy with partial sacralization of the L5 vertebral body. The L5-S1 disc is rudimentary.  There is 4 mm anterolisthesis of L4 on L5. Otherwise, vertebral bodies are normally aligned with preservation of the normal lumbar lordosis.  There is anterior and mid height loss along the right aspect of the superior endplate of Z66. Height loss measures up to 80% of height loss. This appears late subacute to chronic in nature. Prominent degenerative endplate changes present within the adjacent inferior endplate of A63 with associated degenerative Schmorl's node. Prominent anterior osteophytic spurring present at this level. Otherwise, vertebral body heights are preserved.  Conus medullaris terminates normally at the L1-2 level. Signal intensity within  the visualized cord is normal.  No acute paraspinous soft tissue abnormality. Aneurysmal dilatation of the intra-abdominal aorta at to 3.7 cm. Scattered cyst noted within the partially visualized kidneys bilaterally.  T10-11: Seen only on sagittal projection. Degenerative intervertebral disc space narrowing with intervertebral disc space height loss and mild diffuse disc bulge. Mild canal stenosis. Foramina are grossly patent.  T11-12: Height loss along the right aspect of the superior endplate of K16 of up to 80%. No significant bony retropulsion. Degenerative endplate changes at the inferior endplate of W10 with associated Schmorl's node. Prominent bulky anterior osteophytic spurring, abutting the posterior aspect of the  aorta. There is superimposed diffuse disc bulging at this level. Severe bilateral facet arthrosis. There is a superimposed synovial cyst arising from the anteromedial aspect of the left T11-12 facet that measures 0.5 x 0.7 x 1.0 cm (series 500, image 5). There is resultant severe canal stenosis at this level with compression of the distal spinal cord. Thecal sac measures 4 mm at its most narrow point. No associated cord signal changes. Severe bilateral foraminal narrowing present as well.  T12-L1: No disc bulge or disc protrusion. Mild disc desiccation. Mild bilateral facet hypertrophy. No significant stenosis.  L1-2: No significant disc bulge or disc protrusion. Mild disc desiccation. Mild facet hypertrophy. No significant canal or foraminal stenosis.  L2-3: Small left foraminal disc protrusion without associated neural impingement (series 500, image 24). Facet and ligamentous hypertrophy. Resultant mild canal and lateral recess stenosis. Foramina remain widely patent.  L3-4: Mild diffuse annular disc bulge without focal disc herniation. Predominately left-sided endplate osteophytic spurring. Mild facet and ligamentous hypertrophy. No significant canal or foraminal stenosis.  L4-5: 4 mm  anterolisthesis of L4 on L5. There is associated diffuse disc bulge with severe bilateral facet arthrosis and ligamentum flavum hypertrophy. There is resultant severe canal stenosis with the thecal sac measuring approximately 6 mm in AP diameter at its most narrow point. Epidural lipomatosis within the ventral epidural space posterior to the L4 vertebral body. Mild bilateral foraminal narrowing present as well.  L5-S1: Somewhat rudimentary L5-S1 disc. Prominent degenerative endplate changes with mild diffuse disc bulge. Mild bilateral facet hypertrophy. No significant canal stenosis. Mild right foraminal narrowing.  IMPRESSION: 1. Multifactorial degenerative changes with resultant severe canal stenosis and cord compression at the level of T11-12 as detailed above. No associated cord signal changes. 2. 4 mm anterolisthesis of L4 on L5 with associated disc bulge and severe facet arthrosis, resulting in severe canal stenosis at this level. 3. Approximately 80% of height loss at the right aspect of the superior endplate of I50, likely late subacute to chronic in nature. Vertebral body heights otherwise preserved. 4. Additional more mild multilevel degenerative changes as detailed above. 5. Transitional lumbosacral anatomy with partial sacralization of the L5 vertebral body. Careful correlation with the numbering system on this exam and prior plain film radiographs recommended prior to any potential future surgical intervention. 6. Intra-abdominal aortic aneurysm measuring up to 3.6 cm in diameter. Critical Value/emergent results were called by telephone at the time of interpretation on 10/26/2014 at 11:25 pm to Dr. Gean Birchwood , who verbally acknowledged these results.   Electronically Signed   By: Jeannine Boga M.D.   On: 10/26/2014 23:28    2D ECHO:   Disposition and Follow-up:    DISPOSITION: Skilled nursing facility for rehabilitation  DISCHARGE FOLLOW-UP     Follow-up Information     Follow up with Faythe Ghee, MD. Schedule an appointment as soon as possible for a visit in 2 weeks.   Specialty:  Neurosurgery   Why:  for hospital follow-up   Contact information:   [address] [place] [address] [phone]       Follow up with Irven Shelling, MD. Schedule an appointment as soon as possible for a visit in 2 weeks.   Specialty:  Internal Medicine   Why:  for hospital follow-up   Contact information:   [address] [place] [address] [phone]        Time spent on Discharge: 35 mins   Signed:   Ordell Prichett M.D.  [facility] 10/29/2014, 12:03 PM Pager: [phone]

## 2014-11-01 ENCOUNTER — Encounter: Payer: Self-pay | Admitting: Adult Health

## 2014-11-01 ENCOUNTER — Ambulatory Visit: Payer: Medicare Other | Admitting: Endocrinology

## 2014-11-01 ENCOUNTER — Non-Acute Institutional Stay (SKILLED_NURSING_FACILITY): Payer: 59 | Admitting: Adult Health

## 2014-11-01 DIAGNOSIS — D696 Thrombocytopenia, unspecified: Secondary | ICD-10-CM

## 2014-11-01 DIAGNOSIS — M1711 Unilateral primary osteoarthritis, right knee: Secondary | ICD-10-CM | POA: Diagnosis not present

## 2014-11-01 DIAGNOSIS — N183 Chronic kidney disease, stage 3 unspecified: Secondary | ICD-10-CM

## 2014-11-01 DIAGNOSIS — M545 Low back pain, unspecified: Secondary | ICD-10-CM

## 2014-11-01 DIAGNOSIS — E119 Type 2 diabetes mellitus without complications: Secondary | ICD-10-CM | POA: Diagnosis not present

## 2014-11-01 DIAGNOSIS — I10 Essential (primary) hypertension: Secondary | ICD-10-CM | POA: Diagnosis not present

## 2014-11-01 DIAGNOSIS — G4700 Insomnia, unspecified: Secondary | ICD-10-CM | POA: Diagnosis not present

## 2014-11-01 DIAGNOSIS — E785 Hyperlipidemia, unspecified: Secondary | ICD-10-CM | POA: Diagnosis not present

## 2014-11-01 DIAGNOSIS — F0390 Unspecified dementia without behavioral disturbance: Secondary | ICD-10-CM | POA: Diagnosis not present

## 2014-11-01 DIAGNOSIS — R531 Weakness: Secondary | ICD-10-CM

## 2014-11-01 DIAGNOSIS — N400 Enlarged prostate without lower urinary tract symptoms: Secondary | ICD-10-CM | POA: Diagnosis not present

## 2014-11-02 ENCOUNTER — Non-Acute Institutional Stay (SKILLED_NURSING_FACILITY): Payer: 59 | Admitting: Internal Medicine

## 2014-11-02 DIAGNOSIS — I10 Essential (primary) hypertension: Secondary | ICD-10-CM | POA: Diagnosis not present

## 2014-11-02 DIAGNOSIS — D649 Anemia, unspecified: Secondary | ICD-10-CM

## 2014-11-02 DIAGNOSIS — M1711 Unilateral primary osteoarthritis, right knee: Secondary | ICD-10-CM | POA: Diagnosis not present

## 2014-11-02 DIAGNOSIS — N183 Chronic kidney disease, stage 3 unspecified: Secondary | ICD-10-CM

## 2014-11-02 DIAGNOSIS — E119 Type 2 diabetes mellitus without complications: Secondary | ICD-10-CM

## 2014-11-02 DIAGNOSIS — R350 Frequency of micturition: Secondary | ICD-10-CM | POA: Diagnosis not present

## 2014-11-02 DIAGNOSIS — M4806 Spinal stenosis, lumbar region: Secondary | ICD-10-CM | POA: Diagnosis not present

## 2014-11-02 DIAGNOSIS — F0390 Unspecified dementia without behavioral disturbance: Secondary | ICD-10-CM | POA: Diagnosis not present

## 2014-11-02 DIAGNOSIS — R531 Weakness: Secondary | ICD-10-CM | POA: Diagnosis not present

## 2014-11-02 DIAGNOSIS — N400 Enlarged prostate without lower urinary tract symptoms: Secondary | ICD-10-CM | POA: Diagnosis not present

## 2014-11-02 DIAGNOSIS — D696 Thrombocytopenia, unspecified: Secondary | ICD-10-CM

## 2014-11-02 DIAGNOSIS — M25561 Pain in right knee: Secondary | ICD-10-CM | POA: Insufficient documentation

## 2014-11-02 DIAGNOSIS — M48061 Spinal stenosis, lumbar region without neurogenic claudication: Secondary | ICD-10-CM

## 2014-11-02 NOTE — Progress Notes (Signed)
Patient ID: Alexis Lopez [facility], male   DOB: [birth year], 81 y.o.   MRN: [id number]      [facility]   PCP: Irven Shelling, MD  Code Status: full code  No Known Allergies  Chief Complaint  Patient presents with  . New Admit To SNF     HPI:  81 y.o. patient is here for short term rehabilitation post hospital admission from 10/26/14-10/29/14 with worsening low back pain post fall. MRI showed severe canal stenosis and cord compression at level T11-12 and spinal canal stenosis at L4-5 with disc bulge. Neurosurgery was consulted and felt that patient was not a candidate for surgery. He was started on iv decadron, had some improvement and was switched to po decadron with plan of slow tapering. He has PMH of ckd 3, HTN, chronic anemia, diabetes mellitus, dementia. He is seen in his room today with his daughter present. He has increased swelling in his legs and was started on ted hose yesterday. As per daughter, he was trying to get something out of the drawer with his right arm and she feels he hurt his shoulder. Patient denies any pain. Daughter is concerned about him waking up frequently at night time to urinate and at home he wakes up twice at night. She also mentions that his fluid intake has increased in the facility. He denies any burning or pain with urination. Denies flank pain. She is concerned about the patient's right knee OA and would like for him to be seen by orthopedics for this. He had a bowel movement yesterday.   Review of Systems:  Constitutional: positive for easy fatigue. Negative for fever, chills, diaphoresis.  HENT: Negative for headache, congestion, nasal discharge Eyes: Negative for eye pain, blurred vision, double vision and discharge.  Respiratory: Negative for cough, shortness of breath and wheezing.   Cardiovascular: Negative for chest pain, palpitations. Positive for leg swelling.  Gastrointestinal: Negative for heartburn, nausea,  vomiting, abdominal pain Genitourinary: Negative for hematuria Musculoskeletal: Negative for falls Skin: Negative for itching, rash.  Neurological: Negative for dizziness, tingling, focal weakness Psychiatric/Behavioral: Negative for depression  Past Medical History  Diagnosis Date  . BPH (benign prostatic hyperplasia)   . Bladder cancer   . Diabetes mellitus without complication   . Hypertension   . Chronic kidney disease    Past Surgical History  Procedure Laterality Date  . Eye surgery      Cataract   Social History:   reports that he has quit smoking. He started smoking about 46 years ago. He has never used smokeless tobacco. He reports that he does not drink alcohol or use illicit drugs.  Family History  Problem Relation Age of Onset  . Diabetes Sister     Medications:   Medication List       This list is accurate as of: 11/02/14  8:32 AM.  Always use your most recent med list.               ACCU-CHEK FASTCLIX LANCETS Misc  Use to check blood sugars 2 times per day     amLODipine 10 MG tablet  Commonly known as:  NORVASC  Take 10 mg by mouth daily.     aspirin 81 MG tablet  Take 81 mg by mouth daily.     brimonidine 0.15 % ophthalmic solution  Commonly known as:  ALPHAGAN  Place 1-2 drops into both eyes 2 (two) times daily.     calcitRIOL 0.25 MCG capsule  Commonly known as:  ROCALTROL  Take 1 capsule (0.25 mcg total) by mouth daily.     dexamethasone 2 MG tablet  Commonly known as:  DECADRON  Take 1 tablet (2 mg total) by mouth 3 (three) times daily.     doxazosin 8 MG tablet  Commonly known as:  CARDURA  Take 8 mg by mouth daily.     glucose blood test strip  Commonly known as:  ACCU-CHEK SMARTVIEW  Use as instructed to check blood sugars 2 times per day, dx code 250.00     HYDROcodone-acetaminophen 5-325 MG per tablet  Commonly known as:  NORCO/VICODIN  Take 1 tablet by mouth every 4 (four) hours as needed for moderate pain or severe pain.      insulin NPH Human 100 UNIT/ML injection  Commonly known as:  HUMULIN N,NOVOLIN N  Inject 0.3 mLs (30 Units total) into the skin 2 (two) times daily before a meal.     pravastatin 40 MG tablet  Commonly known as:  PRAVACHOL  Take 40 mg by mouth daily.     tamsulosin 0.4 MG Caps capsule  Commonly known as:  FLOMAX  Take 0.4 mg by mouth daily.     timolol 0.5 % ophthalmic solution  Commonly known as:  TIMOPTIC  Place 1-2 drops into both eyes 2 (two) times daily.         Physical Exam: Filed Vitals:   11/02/14 0831  BP: 190/78  Pulse: 67  Temp: 98.2 F (36.8 C)  Resp: 19  Weight: 186 lb 3.2 oz (84.46 kg)  SpO2: 99%   Recheck BP 116/60, HR 76/min  General- elderly male, well built, in no acute distress Head- normocephalic, atraumatic Throat- moist mucus membrane Eyes- PERRLA, EOMI, no pallor, no icterus, no discharge Neck- no cervical lymphadenopathy Cardiovascular- normal s1,s2, no murmurs, palpable dorsalis pedis, trace leg edema Respiratory- bilateral clear to auscultation, no wheeze, no rhonchi, no crackles, no use of accessory muscles Abdomen- bowel sounds present, soft, non tender Musculoskeletal- able to move all 4 extremities, generalized weakness, no spinal or paraspinal tenderness, ted hose to legs, normal ROM a t both shoulder  Neurological- no focal deficit, alert and oriented to person, place and time Skin- warm and dry Psychiatry- normal mood and affect    Labs reviewed: Basic Metabolic Panel:  Recent Labs  08/24/14 0757 10/26/14 1430 10/27/14 0545  NA 137 138 140  K 4.0 4.7 4.6  CL 108 109 109  CO2 21 20* 25  GLUCOSE 106* 150* 112*  BUN 28* 20 22*  CREATININE 2.02* 1.88* 1.87*  CALCIUM 9.1 8.9 8.6*   Liver Function Tests:  Recent Labs  10/26/14 1430  AST 29  ALT 14*  ALKPHOS 75  BILITOT 0.8  PROT 6.8  ALBUMIN 3.6   No results for input(s): LIPASE, AMYLASE in the last 8760 hours. No results for input(s): AMMONIA in the  last 8760 hours. CBC:  Recent Labs  10/26/14 1430 10/27/14 0545  WBC 5.1 5.5  NEUTROABS 3.0  --   HGB 13.0 12.0*  HCT 38.2* 36.0*  MCV 89.0 90.7  PLT 124* 120*   Cardiac Enzymes: No results for input(s): CKTOTAL, CKMB, CKMBINDEX, TROPONINI in the last 8760 hours. BNP: Invalid input(s): POCBNP CBG:  Recent Labs  10/28/14 2122 10/29/14 0555 10/29/14 1108  GLUCAP 230* 183* 122*    Radiological Exams: Dg Lumbar Spine Complete  10/26/2014   CLINICAL DATA:  Lumbago for 2 weeks.  Patient fell 1 month prior  EXAM: LUMBAR SPINE - COMPLETE 4+ VIEW  COMPARISON:  None.  FINDINGS: Frontal, lateral, spot lumbosacral lateral, and bilateral oblique views were obtained. There are 5 non-rib-bearing lumbar type vertebral bodies. There is no fracture or spondylolisthesis. There is moderate disc space narrowing at L4-5 and L5-S1. There is also disc space narrowing in the visualized lower thoracic levels.  There are osteophytes and syndesmophytes in the lumbar spine. No sacroiliitis appreciable. There is facet osteoarthritic change at all levels bilaterally.  IMPRESSION: Multilevel osteoarthritic change. There also syndesmotic fights suggesting superimposed seronegative spondyloarthropathy. No sacroiliitis. No fracture or spondylolisthesis.   Electronically Signed   By: Lowella Grip III M.D.   On: 10/26/2014 18:17   Ct Head Wo Contrast  10/26/2014   CLINICAL DATA:  Generalized weakness. Difficulty walking. Fall several days ago.  EXAM: CT HEAD WITHOUT CONTRAST  TECHNIQUE: Contiguous axial images were obtained from the base of the skull through the vertex without intravenous contrast.  COMPARISON:  04/11/2012.  FINDINGS: No mass lesion, mass effect, midline shift, hydrocephalus, hemorrhage. No acute territorial cortical ischemia/infarct. Atrophy and chronic ischemic white matter disease is present. Dystrophic calcification is present at the a atrium of the LEFT lateral ventricle. This is a chronic  finding. Severe RIGHT temporomandibular joint osteoarthritis. RIGHT lens extraction.  IMPRESSION: Atrophy and chronic ischemic white matter disease without acute intracranial abnormality.   Electronically Signed   By: Dereck Ligas M.D.   On: 10/26/2014 17:52   Mr Lumbar Spine Wo Contrast  10/26/2014   CLINICAL DATA:  Initial evaluation for acute weakness, low back pain, difficulty with ambulation.  EXAM: MRI LUMBAR SPINE WITHOUT CONTRAST  TECHNIQUE: Multiplanar, multisequence MR imaging of the lumbar spine was performed. No intravenous contrast was administered.  COMPARISON:  Prior radiograph from earlier the same day.  FINDINGS: For the purposes of this dictation, the lowest well-formed intervertebral disc spaces presumed to be the L5-S1 level, and there presumed to be 5 lumbar type vertebral bodies. There is transitional lumbosacral anatomy with partial sacralization of the L5 vertebral body. The L5-S1 disc is rudimentary.  There is 4 mm anterolisthesis of L4 on L5. Otherwise, vertebral bodies are normally aligned with preservation of the normal lumbar lordosis.  There is anterior and mid height loss along the right aspect of the superior endplate of O96. Height loss measures up to 80% of height loss. This appears late subacute to chronic in nature. Prominent degenerative endplate changes present within the adjacent inferior endplate of E95 with associated degenerative Schmorl's node. Prominent anterior osteophytic spurring present at this level. Otherwise, vertebral body heights are preserved.  Conus medullaris terminates normally at the L1-2 level. Signal intensity within the visualized cord is normal.  No acute paraspinous soft tissue abnormality. Aneurysmal dilatation of the intra-abdominal aorta at to 3.7 cm. Scattered cyst noted within the partially visualized kidneys bilaterally.  T10-11: Seen only on sagittal projection. Degenerative intervertebral disc space narrowing with intervertebral disc space  height loss and mild diffuse disc bulge. Mild canal stenosis. Foramina are grossly patent.  T11-12: Height loss along the right aspect of the superior endplate of M84 of up to 80%. No significant bony retropulsion. Degenerative endplate changes at the inferior endplate of X32 with associated Schmorl's node. Prominent bulky anterior osteophytic spurring, abutting the posterior aspect of the aorta. There is superimposed diffuse disc bulging at this level. Severe bilateral facet arthrosis. There is a superimposed synovial cyst arising from the anteromedial aspect of the left T11-12 facet that measures 0.5 x 0.7  x 1.0 cm (series 500, image 5). There is resultant severe canal stenosis at this level with compression of the distal spinal cord. Thecal sac measures 4 mm at its most narrow point. No associated cord signal changes. Severe bilateral foraminal narrowing present as well.  T12-L1: No disc bulge or disc protrusion. Mild disc desiccation. Mild bilateral facet hypertrophy. No significant stenosis.  L1-2: No significant disc bulge or disc protrusion. Mild disc desiccation. Mild facet hypertrophy. No significant canal or foraminal stenosis.  L2-3: Small left foraminal disc protrusion without associated neural impingement (series 500, image 24). Facet and ligamentous hypertrophy. Resultant mild canal and lateral recess stenosis. Foramina remain widely patent.  L3-4: Mild diffuse annular disc bulge without focal disc herniation. Predominately left-sided endplate osteophytic spurring. Mild facet and ligamentous hypertrophy. No significant canal or foraminal stenosis.  L4-5: 4 mm anterolisthesis of L4 on L5. There is associated diffuse disc bulge with severe bilateral facet arthrosis and ligamentum flavum hypertrophy. There is resultant severe canal stenosis with the thecal sac measuring approximately 6 mm in AP diameter at its most narrow point. Epidural lipomatosis within the ventral epidural space posterior to the L4  vertebral body. Mild bilateral foraminal narrowing present as well.  L5-S1: Somewhat rudimentary L5-S1 disc. Prominent degenerative endplate changes with mild diffuse disc bulge. Mild bilateral facet hypertrophy. No significant canal stenosis. Mild right foraminal narrowing.  IMPRESSION: 1. Multifactorial degenerative changes with resultant severe canal stenosis and cord compression at the level of T11-12 as detailed above. No associated cord signal changes. 2. 4 mm anterolisthesis of L4 on L5 with associated disc bulge and severe facet arthrosis, resulting in severe canal stenosis at this level. 3. Approximately 80% of height loss at the right aspect of the superior endplate of N40, likely late subacute to chronic in nature. Vertebral body heights otherwise preserved. 4. Additional more mild multilevel degenerative changes as detailed above. 5. Transitional lumbosacral anatomy with partial sacralization of the L5 vertebral body. Careful correlation with the numbering system on this exam and prior plain film radiographs recommended prior to any potential future surgical intervention. 6. Intra-abdominal aortic aneurysm measuring up to 3.6 cm in diameter. Critical Value/emergent results were called by telephone at the time of interpretation on 10/26/2014 at 11:25 pm to Dr. Gean Birchwood , who verbally acknowledged these results.   Electronically Signed   By: Jeannine Boga M.D.   On: 10/26/2014 23:28    Assessment/Plan  Generalized weakness Will have him work with physical therapy and occupational therapy team to help with gait training and muscle strengthening exercises.fall precautions. Skin care. Encourage to be out of bed.   Lumbar spinal stenosis Seen by neurosurgery. Currently on decadron 2 mg bid, continue this dosage for a week  And then 2 mg daily for a week and taper further. Pain controlled. WBAT with rollator walker.fall precautions. Has f/u with neurosurgery. Continue norco 5-325 mg 1 tab  q4h prn pain.   Urinary frequency His BPH and increased fluid intake could both be contributing to this. Advised on limiting fluid intake in the evening for now. Also send urine for analysis to rule out infection  Hypertension Improved reading on recheck. continue Norvasc 10 mg daily for now with cardura and check bp on daily basis  Chronic anemia Lab Results  Component Value Date   WBC 5.5 10/27/2014   HGB 12.0* 10/27/2014   HCT 36.0* 10/27/2014   MCV 90.7 10/27/2014   PLT 120* 10/27/2014  monitor h&h  Thrombocytopenia No signs of bleed.  Monitor platelet count  Right knee OA Reviewed xray report. Orthopedic appointment pending. Continue current prn pain medication and to work with therapy team  Dementia without behavioral disturbance Stable, alert and oriented today. Off aricept for now  CKD III  Lab Results  Component Value Date   CREATININE 1.87* 10/27/2014  monitor bmp  BPH On doxazosin and tamsulosin, continue this and monitor  Diabetes Mellitus Lab Results  Component Value Date   HGBA1C 6.3 08/24/2014  continue humulin N 30 u am and 26 u pm with humulin R 8 u bid home regimen as per daughter. Monitor cbg and check a1c given his age and last a1c of 6.3.      Goals of care: short term rehabilitation   Labs/tests ordered: cbc, cmp, a1c  Family/ staff Communication: reviewed care plan with patient and nursing supervisor    Blanchie Serve, MD  [facility] [phone] (Monday-Friday 8 am - 5 pm) [phone] (afterhours)

## 2014-11-02 NOTE — Progress Notes (Signed)
Patient ID: Alexis Lopez [facility], male   DOB: 1920-06-07, 81 y.o.   MRN: [id number]    DATE: 11/01/14 MRN:  [id number]  BIRTHDAY: [birth year]  Facility:  Nursing Home Location:  [place] Room Number: 740-C  LEVEL OF CARE:  SNF (31)  Contact Information    Name Relation Home Work Mobile   Mccowen,Charlotte Spouse [phone]     [name] Daughter [phone]        Chief Complaint  Patient presents with  . Hospitalization Follow-up    Generalized weakness, Low back pain, Hypertension, CKD III, Diabetes Mellitus type 2, Dementia, Thrombocytopenia, BPH, Right knee DJD and Hyperlipidemia    HISTORY OF PRESENT ILLNESS:  This is a 81 year old male who has been admitted to [facility] on 10/29/14 from [facility]. He has PMH of Diabetes Mellitus, Hypertension, CKD and Dementia. He fell at home a week before going to ER. He was having difficulty walking and increased low back pain. He did not have hyperreflexia of the lower extremity. MRI of the lumbar spine showed multifactorial degenerative changes with resultant severe canal stenosis and cord compression of the level T11 & 12, severe canal stenosis @ L4 on L5 with disc bulge. Intraabdominal aneurysm up to 3.6 cm. Neurosurgery consulted and patient was felt an an ideal candidate for surgery. He was given Decadron IV X 3 days and discharged on oral Decadron then slowly taper off.  He has been admitted for a short-term term rehabilitation.  PAST MEDICAL HISTORY:  Past Medical History  Diagnosis Date  . BPH (benign prostatic hyperplasia)   . Bladder cancer   . Diabetes mellitus without complication   . Hypertension   . Chronic kidney disease     CURRENT MEDICATIONS: Reviewed      Medication List       This list is accurate as of: 11/01/14 11:59 PM.  Always use your most recent med list.               ACCU-CHEK FASTCLIX LANCETS Misc  Use to check blood sugars 2 times per day     amLODipine 10 MG tablet  Commonly known as:  NORVASC  Take 10 mg by mouth daily.     aspirin 81 MG tablet  Take 81 mg by mouth daily.     brimonidine 0.15 % ophthalmic solution  Commonly known as:  ALPHAGAN  Place 1-2 drops into both eyes 2 (two) times daily.     calcitRIOL 0.25 MCG capsule  Commonly known as:  ROCALTROL  Take 1 capsule (0.25 mcg total) by mouth daily.     dexamethasone 2 MG tablet  Commonly known as:  DECADRON  Take 2 mg by mouth 2 (two) times daily.     doxazosin 8 MG tablet  Commonly known as:  CARDURA  Take 8 mg by mouth daily.     glucose blood test strip  Commonly known as:  ACCU-CHEK SMARTVIEW  Use as instructed to check blood sugars 2 times per day, dx code 250.00     HYDROcodone-acetaminophen 5-325 MG per tablet  Commonly known as:  NORCO/VICODIN  Take 1 tablet by mouth every 4 (four) hours as needed for moderate pain or severe pain.     insulin NPH Human 100 UNIT/ML injection  Commonly known as:  HUMULIN N,NOVOLIN N  Inject 0.3 mLs (30 Units total) into the skin 2 (two) times daily before a meal.     Melatonin 5 MG Tabs  Take 5 mg by mouth at bedtime.     pravastatin 40 MG tablet  Commonly known as:  PRAVACHOL  Take 40 mg by mouth daily.     tamsulosin 0.4 MG Caps capsule  Commonly known as:  FLOMAX  Take 0.4 mg by mouth daily.     timolol 0.5 % ophthalmic solution  Commonly known as:  TIMOPTIC  Place 1-2 drops into both eyes 2 (two) times daily.         No Known Allergies   REVIEW OF SYSTEMS:  GENERAL: no change in appetite, no fatigue, no weight changes, no fever, chills or weakness EYES: Denies change in vision, dry eyes, eye pain, itching or discharge EARS: Denies change in hearing, ringing in ears, or earache NOSE: Denies nasal congestion or epistaxis MOUTH and THROAT: Denies oral discomfort, gingival pain or bleeding, pain from teeth or hoarseness   RESPIRATORY: no cough, SOB, DOE, wheezing, hemoptysis CARDIAC: no chest  pain, edema or palpitations GI: no abdominal pain, diarrhea, constipation, heart burn, nausea or vomiting GU: Denies dysuria, frequency, hematuria, incontinence, or discharge PSYCHIATRIC: Denies feeling of depression or anxiety. No report of hallucinations, insomnia, paranoia, or agitation    PHYSICAL EXAMINATION  GENERAL APPEARANCE: Well nourished. In no acute distress. Normal body habitus SKIN:  Skin is warm and dry. There are no suspicious lesions or rash HEAD: Normal in size and contour. No evidence of trauma EYES: Lids open and close normally. No blepharitis, entropion or ectropion. PERRL. Conjunctivae are clear and sclerae are white. Lenses are without opacity EARS: Pinnae are normal. Patient hears normal voice tunes of the examiner MOUTH and THROAT: Lopez are without lesions. Oral mucosa is moist and without lesions. Tongue is normal in shape, size, and color and without lesions NECK: supple, trachea midline, no neck masses, no thyroid tenderness, no thyromegaly LYMPHATICS: no LAN in the neck, no supraclavicular LAN RESPIRATORY: breathing is even & unlabored, BS CTAB CARDIAC: RRR, no murmur,no extra heart sounds, BLE edema 1+ GI: abdomen soft, normal BS, no masses, no tenderness, no hepatomegaly, no splenomegaly EXTREMITIES:  Able to move X 4 extremities, BLE weakness PSYCHIATRIC: Alert and oriented X 3. Affect and behavior are appropriate  LABS/RADIOLOGY: Labs reviewed: Basic Metabolic Panel:  Recent Labs  08/24/14 0757 10/26/14 1430 10/27/14 0545  NA 137 138 140  K 4.0 4.7 4.6  CL 108 109 109  CO2 21 20* 25  GLUCOSE 106* 150* 112*  BUN 28* 20 22*  CREATININE 2.02* 1.88* 1.87*  CALCIUM 9.1 8.9 8.6*   Liver Function Tests:  Recent Labs  10/26/14 1430  AST 29  ALT 14*  ALKPHOS 75  BILITOT 0.8  PROT 6.8  ALBUMIN 3.6   CBC:  Recent Labs  10/26/14 1430 10/27/14 0545  WBC 5.1 5.5  NEUTROABS 3.0  --   HGB 13.0 12.0*  HCT 38.2* 36.0*  MCV 89.0 90.7  PLT  124* 120*   Lipid Panel:  Recent Labs  01/04/14 1332 08/24/14 0757  HDL 29.40* 31.60*    CBG:  Recent Labs  10/28/14 2122 10/29/14 0555 10/29/14 1108  GLUCAP 230* 183* 122*      Dg Lumbar Spine Complete  10/26/2014   CLINICAL DATA:  Lumbago for 2 weeks.  Patient fell 1 month prior  EXAM: LUMBAR SPINE - COMPLETE 4+ VIEW  COMPARISON:  None.  FINDINGS: Frontal, lateral, spot lumbosacral lateral, and bilateral oblique views were obtained. There are 5 non-rib-bearing lumbar type vertebral bodies. There is no fracture or spondylolisthesis.  There is moderate disc space narrowing at L4-5 and L5-S1. There is also disc space narrowing in the visualized lower thoracic levels.  There are osteophytes and syndesmophytes in the lumbar spine. No sacroiliitis appreciable. There is facet osteoarthritic change at all levels bilaterally.  IMPRESSION: Multilevel osteoarthritic change. There also syndesmotic fights suggesting superimposed seronegative spondyloarthropathy. No sacroiliitis. No fracture or spondylolisthesis.   Electronically Signed   By: Lowella Grip III M.D.   On: 10/26/2014 18:17   Ct Head Wo Contrast  10/26/2014   CLINICAL DATA:  Generalized weakness. Difficulty walking. Fall several days ago.  EXAM: CT HEAD WITHOUT CONTRAST  TECHNIQUE: Contiguous axial images were obtained from the base of the skull through the vertex without intravenous contrast.  COMPARISON:  04/11/2012.  FINDINGS: No mass lesion, mass effect, midline shift, hydrocephalus, hemorrhage. No acute territorial cortical ischemia/infarct. Atrophy and chronic ischemic white matter disease is present. Dystrophic calcification is present at the a atrium of the LEFT lateral ventricle. This is a chronic finding. Severe RIGHT temporomandibular joint osteoarthritis. RIGHT lens extraction.  IMPRESSION: Atrophy and chronic ischemic white matter disease without acute intracranial abnormality.   Electronically Signed   By: Dereck Ligas  M.D.   On: 10/26/2014 17:52   Mr Lumbar Spine Wo Contrast  10/26/2014   CLINICAL DATA:  Initial evaluation for acute weakness, low back pain, difficulty with ambulation.  EXAM: MRI LUMBAR SPINE WITHOUT CONTRAST  TECHNIQUE: Multiplanar, multisequence MR imaging of the lumbar spine was performed. No intravenous contrast was administered.  COMPARISON:  Prior radiograph from earlier the same day.  FINDINGS: For the purposes of this dictation, the lowest well-formed intervertebral disc spaces presumed to be the L5-S1 level, and there presumed to be 5 lumbar type vertebral bodies. There is transitional lumbosacral anatomy with partial sacralization of the L5 vertebral body. The L5-S1 disc is rudimentary.  There is 4 mm anterolisthesis of L4 on L5. Otherwise, vertebral bodies are normally aligned with preservation of the normal lumbar lordosis.  There is anterior and mid height loss along the right aspect of the superior endplate of G31. Height loss measures up to 80% of height loss. This appears late subacute to chronic in nature. Prominent degenerative endplate changes present within the adjacent inferior endplate of D17 with associated degenerative Schmorl's node. Prominent anterior osteophytic spurring present at this level. Otherwise, vertebral body heights are preserved.  Conus medullaris terminates normally at the L1-2 level. Signal intensity within the visualized cord is normal.  No acute paraspinous soft tissue abnormality. Aneurysmal dilatation of the intra-abdominal aorta at to 3.7 cm. Scattered cyst noted within the partially visualized kidneys bilaterally.  T10-11: Seen only on sagittal projection. Degenerative intervertebral disc space narrowing with intervertebral disc space height loss and mild diffuse disc bulge. Mild canal stenosis. Foramina are grossly patent.  T11-12: Height loss along the right aspect of the superior endplate of O16 of up to 80%. No significant bony retropulsion. Degenerative  endplate changes at the inferior endplate of W73 with associated Schmorl's node. Prominent bulky anterior osteophytic spurring, abutting the posterior aspect of the aorta. There is superimposed diffuse disc bulging at this level. Severe bilateral facet arthrosis. There is a superimposed synovial cyst arising from the anteromedial aspect of the left T11-12 facet that measures 0.5 x 0.7 x 1.0 cm (series 500, image 5). There is resultant severe canal stenosis at this level with compression of the distal spinal cord. Thecal sac measures 4 mm at its most narrow point. No associated cord signal changes.  Severe bilateral foraminal narrowing present as well.  T12-L1: No disc bulge or disc protrusion. Mild disc desiccation. Mild bilateral facet hypertrophy. No significant stenosis.  L1-2: No significant disc bulge or disc protrusion. Mild disc desiccation. Mild facet hypertrophy. No significant canal or foraminal stenosis.  L2-3: Small left foraminal disc protrusion without associated neural impingement (series 500, image 24). Facet and ligamentous hypertrophy. Resultant mild canal and lateral recess stenosis. Foramina remain widely patent.  L3-4: Mild diffuse annular disc bulge without focal disc herniation. Predominately left-sided endplate osteophytic spurring. Mild facet and ligamentous hypertrophy. No significant canal or foraminal stenosis.  L4-5: 4 mm anterolisthesis of L4 on L5. There is associated diffuse disc bulge with severe bilateral facet arthrosis and ligamentum flavum hypertrophy. There is resultant severe canal stenosis with the thecal sac measuring approximately 6 mm in AP diameter at its most narrow point. Epidural lipomatosis within the ventral epidural space posterior to the L4 vertebral body. Mild bilateral foraminal narrowing present as well.  L5-S1: Somewhat rudimentary L5-S1 disc. Prominent degenerative endplate changes with mild diffuse disc bulge. Mild bilateral facet hypertrophy. No significant  canal stenosis. Mild right foraminal narrowing.  IMPRESSION: 1. Multifactorial degenerative changes with resultant severe canal stenosis and cord compression at the level of T11-12 as detailed above. No associated cord signal changes. 2. 4 mm anterolisthesis of L4 on L5 with associated disc bulge and severe facet arthrosis, resulting in severe canal stenosis at this level. 3. Approximately 80% of height loss at the right aspect of the superior endplate of X90, likely late subacute to chronic in nature. Vertebral body heights otherwise preserved. 4. Additional more mild multilevel degenerative changes as detailed above. 5. Transitional lumbosacral anatomy with partial sacralization of the L5 vertebral body. Careful correlation with the numbering system on this exam and prior plain film radiographs recommended prior to any potential future surgical intervention. 6. Intra-abdominal aortic aneurysm measuring up to 3.6 cm in diameter. Critical Value/emergent results were called by telephone at the time of interpretation on 10/26/2014 at 11:25 pm to Dr. Gean Birchwood , who verbally acknowledged these results.   Electronically Signed   By: Jeannine Boga M.D.   On: 10/26/2014 23:28    ASSESSMENT/PLAN:  Generalized weakness  - for rehabilitation   Low back pain -  Decrease Decadron to 2 mg PO BID; continue Norco 5/325 mg 1 tab PO Q 4 hours PRN; follow-up with Dr. Hal Neer, neuro surgeon, in 2 weeks  Hypertension - continue Norvasc 10 mg 1 tab PO daily  CKD III -  Creatinine 1.88; check BMP  Diabetes Mellitus, type 2 - hgbA1c 6.3; continue Humulin N 100 units/ml give 30 units SQ BID  Dementia -  Aricept was discontinued in the hospital due to bradycardia  Thrombocytopenia - platelet 120; check CBC  BPH - continue Cardura 8 mg 1 tab PO daily  Hyperlipidemia - continue Pravachol 40 mg 1 tab PO daily  Insomnia - continue Melatonin 5 mg 1 tab PO daily  Right DJD - [facility] showed degenerative  changes; orthopedic consult    Goals of care:  Short-term rehabilitation    [facility], NP [facility] [phone]

## 2014-12-02 ENCOUNTER — Encounter: Payer: Self-pay | Admitting: Adult Health

## 2014-12-02 ENCOUNTER — Non-Acute Institutional Stay (SKILLED_NURSING_FACILITY): Payer: 59 | Admitting: Adult Health

## 2014-12-02 DIAGNOSIS — M4806 Spinal stenosis, lumbar region: Secondary | ICD-10-CM

## 2014-12-02 DIAGNOSIS — M48061 Spinal stenosis, lumbar region without neurogenic claudication: Secondary | ICD-10-CM

## 2014-12-02 DIAGNOSIS — N400 Enlarged prostate without lower urinary tract symptoms: Secondary | ICD-10-CM

## 2014-12-02 DIAGNOSIS — I10 Essential (primary) hypertension: Secondary | ICD-10-CM

## 2014-12-02 DIAGNOSIS — G4700 Insomnia, unspecified: Secondary | ICD-10-CM | POA: Diagnosis not present

## 2014-12-02 DIAGNOSIS — E119 Type 2 diabetes mellitus without complications: Secondary | ICD-10-CM | POA: Diagnosis not present

## 2014-12-02 DIAGNOSIS — R531 Weakness: Secondary | ICD-10-CM | POA: Diagnosis not present

## 2014-12-02 DIAGNOSIS — F0390 Unspecified dementia without behavioral disturbance: Secondary | ICD-10-CM

## 2014-12-02 DIAGNOSIS — N183 Chronic kidney disease, stage 3 unspecified: Secondary | ICD-10-CM

## 2014-12-02 DIAGNOSIS — D696 Thrombocytopenia, unspecified: Secondary | ICD-10-CM

## 2014-12-02 DIAGNOSIS — E785 Hyperlipidemia, unspecified: Secondary | ICD-10-CM | POA: Diagnosis not present

## 2014-12-02 NOTE — Progress Notes (Signed)
Patient ID: Alexis Lopez [facility], male   DOB: Aug 15, 1920, 81 y.o.   MRN: [id number]     DATE: 12/02/14 MRN:  [id number]  BIRTHDAY: 06-Oct-1920  Facility:  Nursing Home Location:  [place] Room Number: 810-F  LEVEL OF CARE:  SNF [phone])      Contact Information    Name Relation Home Work Mobile   Takayama,Charlotte Spouse [place] Daughter [phone]  [phone]      Chief Complaint  Patient presents with  . Discharge Note    Generalized weakness, spinal stenosis in the lumbar region, Hypertension, CKD III, Diabetes Mellitus type 2, Dementia, Thrombocytopenia, BPH, Right knee DJD and Hyperlipidemia    HISTORY OF PRESENT ILLNESS:  This is a 81 year old male who is for discharge home with Home health PT for endurance, OT for ADLs, CNA for showers and Nursing for wound care. DME:  Standard wheelchair, sacrum cushion gel 16" X 16", elevating leg rests, pressure reducing cushion, removable arm rests, anti-tippers and bedside commode. He has been admitted to [facility] on 10/29/14 from [facility]. He has PMH of Diabetes Mellitus, Hypertension, CKD and Dementia. He fell at home a week before going to ER. He was having difficulty walking and increased low back pain. He did not have hyperreflexia of the lower extremity. MRI of the lumbar spine showed multifactorial degenerative changes with resultant severe canal stenosis and cord compression of the level T11 & 12, severe canal stenosis @ L4 on L5 with disc bulge. Intraabdominal aneurysm up to 3.6 cm. Neurosurgery consulted and patient was felt an an ideal candidate for surgery. He was given Decadron IV X 3 days and discharged on oral Decadron then slowly tapered off.  Patient was admitted to this facility for short-term rehabilitation after the patient's recent hospitalization.  Patient has completed SNF rehabilitation and therapy has cleared the patient for discharge.   PAST MEDICAL  HISTORY:  Past Medical History  Diagnosis Date  . BPH (benign prostatic hyperplasia)   . Bladder cancer   . Diabetes mellitus without complication   . Hypertension   . Chronic kidney disease     CURRENT MEDICATIONS: Reviewed      Medication List       This list is accurate as of: 12/02/14  8:47 PM.  Always use your most recent med list.               ACCU-CHEK FASTCLIX LANCETS Misc  Use to check blood sugars 2 times per day     aspirin 81 MG tablet  Take 81 mg by mouth daily.     atorvastatin 10 MG tablet  Commonly known as:  LIPITOR  Take 10 mg by mouth daily.     brimonidine 0.15 % ophthalmic solution  Commonly known as:  ALPHAGAN  Place 1-2 drops into both eyes 2 (two) times daily.     calcitRIOL 0.25 MCG capsule  Commonly known as:  ROCALTROL  Take 1 capsule (0.25 mcg total) by mouth daily.     dexamethasone 2 MG tablet  Commonly known as:  DECADRON  Take 2 mg by mouth 2 (two) times daily.     doxazosin 8 MG tablet  Commonly known as:  CARDURA  Take 8 mg by mouth daily.     glucose blood test strip  Commonly known as:  ACCU-CHEK SMARTVIEW  Use as instructed to check blood sugars 2 times per day, dx code 250.00  HYDROcodone-acetaminophen 5-325 MG per tablet  Commonly known as:  NORCO/VICODIN  Take 1 tablet by mouth every 4 (four) hours as needed for moderate pain or severe pain.     insulin NPH Human 100 UNIT/ML injection  Commonly known as:  HUMULIN N,NOVOLIN N  Inject 26-30 Units into the skin 2 (two) times daily before a meal. 30 units IN AM and 26 units in the evening     insulin regular 100 units/mL injection  Commonly known as:  NOVOLIN R,HUMULIN R  Inject 8 Units into the skin 2 (two) times daily before a meal. Add Humulin R  For CBG 200-300 = 2 units and 301 - 400 = 4 units SQ BID     Melatonin 5 MG Tabs  Take 5 mg by mouth at bedtime as needed.     tamsulosin 0.4 MG Caps capsule  Commonly known as:  FLOMAX  Take 0.4 mg by mouth daily.      timolol 0.5 % ophthalmic solution  Commonly known as:  TIMOPTIC  Place 1-2 drops into both eyes 2 (two) times daily.         No Known Allergies   REVIEW OF SYSTEMS:  GENERAL: no change in appetite, no fatigue, no weight changes, no fever, chills or weakness EYES: Denies change in vision, dry eyes, eye pain, itching or discharge EARS: Denies change in hearing, ringing in ears, or earache NOSE: Denies nasal congestion or epistaxis MOUTH and THROAT: Denies oral discomfort, gingival pain or bleeding, pain from teeth or hoarseness   RESPIRATORY: no cough, SOB, DOE, wheezing, hemoptysis CARDIAC: no chest pain, edema or palpitations GI: no abdominal pain, diarrhea, constipation, heart burn, nausea or vomiting GU: Denies dysuria, frequency, hematuria, incontinence, or discharge PSYCHIATRIC: Denies feeling of depression or anxiety. No report of hallucinations, insomnia, paranoia, or agitation    PHYSICAL EXAMINATION  GENERAL APPEARANCE: Well nourished. In no acute distress. Normal body habitus SKIN:  Stage 2 pressure ulcer on right inner buttock with dressing HEAD: Normal in size and contour. No evidence of trauma EYES: Lids open and close normally. No blepharitis, entropion or ectropion. PERRL. Conjunctivae are clear and sclerae are white. Lenses are without opacity EARS: Pinnae are normal. Patient hears normal voice tunes of the examiner MOUTH and THROAT: Lips are without lesions. Oral mucosa is moist and without lesions. Tongue is normal in shape, size, and color and without lesions NECK: supple, trachea midline, no neck masses, no thyroid tenderness, no thyromegaly LYMPHATICS: no LAN in the neck, no supraclavicular LAN RESPIRATORY: breathing is even & unlabored, BS CTAB CARDIAC: RRR, no murmur,no extra heart sounds, BLE edema 1+ GI: abdomen soft, normal BS, no masses, no tenderness, no hepatomegaly, no splenomegaly EXTREMITIES:  Able to move X 4 extremities, BLE  weakness PSYCHIATRIC: Alert and oriented X 3. Affect and behavior are appropriate  LABS/RADIOLOGY: Labs reviewed: 11/18/14  WBC 10.7 hemoglobin 12.9 hematocrit 41.0 MCV 93.0 sodium 141 potassium 4.0 glucose 94 BUN 30 creatinine 1.73 calcium 8.5 platelet 91 11/03/14  hemoglobin A1c 6.9 11/02/14  WBC 11.4 hemoglobin 12.8 MCV 88.1 platelet 136 sodium 140 potassium 4.3 glucose 94 BUN 30 creatinine 1.48 calcium 8.4 Basic Metabolic Panel:  Recent Labs  08/24/14 0757 10/26/14 1430 10/27/14 0545  NA 137 138 140  K 4.0 4.7 4.6  CL 108 109 109  CO2 21 20* 25  GLUCOSE 106* 150* 112*  BUN 28* 20 22*  CREATININE 2.02* 1.88* 1.87*  CALCIUM 9.1 8.9 8.6*   Liver Function Tests:  Recent Labs  10/26/14 1430  AST 29  ALT 14*  ALKPHOS 75  BILITOT 0.8  PROT 6.8  ALBUMIN 3.6   CBC:  Recent Labs  10/26/14 1430 10/27/14 0545  WBC 5.1 5.5  NEUTROABS 3.0  --   HGB 13.0 12.0*  HCT 38.2* 36.0*  MCV 89.0 90.7  PLT 124* 120*   Lipid Panel:  Recent Labs  01/04/14 1332 08/24/14 0757  HDL 29.40* 31.60*    CBG:  Recent Labs  10/28/14 2122 10/29/14 0555 10/29/14 1108  GLUCAP 230* 183* 122*      No results found.  ASSESSMENT/PLAN:  Generalized weakness  - for home health PT, OT, CNA and nursing   Low back pain -   Decadron has been tapered off; continue Norco 5/325 mg 1 tab PO Q 4 hours PRN; follow-up with Dr. Hal Neer, neuro surgeon  Hypertension - well controlled; Norvasc was discontinued  CKD III -  Creatinine 1.73; stable  Diabetes Mellitus, type 2 - hgbA1c 6.9; continue Humulin N 100 units/ml give 30 units SQ Q AM and 26 units SQ Q evening; Humulin R 8 units SQ BID and SSI BID  Dementia -  Aricept was discontinued in the hospital due to bradycardia  Thrombocytopenia - platelet 91; no bleeding nor bruising  BPH - continue Cardura 8 mg 1 tab PO daily  Hyperlipidemia - continue Lipitor 10 mg 1 tab PO daily  Insomnia - continue Melatonin 5 mg 1 tab PO Q HS  PRN     I have filled out patient's discharge paperwork and written prescriptions.  Patient will receive home health PT, OT,Nursing and CNA.  DME provided:  Standard wheelchair, sacrum cushion gel 16" X 16", elevating leg rests, pressure reducing cushion, removable arm rests, anti-tippers and bedside commode  Total discharge time: Greater than 30 minutes  Discharge time involved coordination of the discharge process with social worker, nursing staff and therapy department. Medical justification for home health services/DME verified.     [facility] NP [employer] [phone]

## 2015-01-11 ENCOUNTER — Encounter: Payer: Self-pay | Admitting: Podiatry

## 2015-01-11 ENCOUNTER — Ambulatory Visit (INDEPENDENT_AMBULATORY_CARE_PROVIDER_SITE_OTHER): Payer: Medicare Other | Admitting: Podiatry

## 2015-01-11 DIAGNOSIS — B351 Tinea unguium: Secondary | ICD-10-CM | POA: Diagnosis not present

## 2015-01-11 DIAGNOSIS — M79676 Pain in unspecified toe(s): Secondary | ICD-10-CM | POA: Diagnosis not present

## 2015-01-11 NOTE — Progress Notes (Signed)
Patient ID: Alexis Lopez [facility], male   DOB: [birth year], 81 y.o.   MRN: [id number] Complaint:  Visit Type: Patient returns to my office for continued preventative foot care services. Complaint: Patient states" my nails have grown long and thick and become painful to walk and wear shoes" Patient has been diagnosed with DM with neuropathy.. The patient presents for preventative foot care services. No changes to ROS  Podiatric Exam: Vascular: dorsalis pedis and posterior tibial pulses are palpable bilateral. Capillary return is immediate. Temperature gradient is WNL. Skin turgor WNL  Sensorium: Diminished  Semmes Weinstein monofilament test. Normal tactile sensation bilaterally. Nail Exam: Pt has thick disfigured discolored nails with subungual debris noted bilateral entire nail hallux through fifth toenails Ulcer Exam: There is no evidence of ulcer or pre-ulcerative changes or infection. Orthopedic Exam: Muscle tone and strength are WNL. No limitations in general ROM. No crepitus or effusions noted. Foot type and digits show no abnormalities. Bony prominences are unremarkable. Skin: No Porokeratosis. No infection or ulcers  Diagnosis:  Onychomycosis, , Pain in right toe, pain in left toes  Treatment & Plan Procedures and Treatment: Consent by patient was obtained for treatment procedures. The patient understood the discussion of treatment and procedures well. All questions were answered thoroughly reviewed. Debridement of mycotic and hypertrophic toenails, 1 through 5 bilateral and clearing of subungual debris. No ulceration, no infection noted.  Return Visit-Office Procedure: Patient instructed to return to the office for a follow up visit 3 months for continued evaluation and treatment.

## 2015-01-26 ENCOUNTER — Other Ambulatory Visit: Payer: Self-pay | Admitting: Endocrinology

## 2015-01-26 ENCOUNTER — Other Ambulatory Visit (HOSPITAL_COMMUNITY): Payer: Self-pay | Admitting: Neurosurgery

## 2015-02-02 ENCOUNTER — Other Ambulatory Visit (HOSPITAL_COMMUNITY): Payer: Self-pay | Admitting: *Deleted

## 2015-02-03 ENCOUNTER — Other Ambulatory Visit (HOSPITAL_COMMUNITY): Payer: 59

## 2015-02-03 ENCOUNTER — Encounter (HOSPITAL_COMMUNITY): Payer: Self-pay

## 2015-02-03 ENCOUNTER — Encounter (HOSPITAL_COMMUNITY)
Admission: RE | Admit: 2015-02-03 | Discharge: 2015-02-03 | Disposition: A | Discharge status: Home / Self Care | Payer: 59 | Source: Ambulatory Visit | Attending: Neurosurgery | Admitting: Neurosurgery

## 2015-02-03 DIAGNOSIS — E1122 Type 2 diabetes mellitus with diabetic chronic kidney disease: Secondary | ICD-10-CM | POA: Insufficient documentation

## 2015-02-03 DIAGNOSIS — Z01812 Encounter for preprocedural laboratory examination: Secondary | ICD-10-CM | POA: Diagnosis not present

## 2015-02-03 DIAGNOSIS — Z794 Long term (current) use of insulin: Secondary | ICD-10-CM | POA: Insufficient documentation

## 2015-02-03 DIAGNOSIS — Z01818 Encounter for other preprocedural examination: Secondary | ICD-10-CM | POA: Insufficient documentation

## 2015-02-03 DIAGNOSIS — I129 Hypertensive chronic kidney disease with stage 1 through stage 4 chronic kidney disease, or unspecified chronic kidney disease: Secondary | ICD-10-CM | POA: Diagnosis not present

## 2015-02-03 DIAGNOSIS — N189 Chronic kidney disease, unspecified: Secondary | ICD-10-CM | POA: Diagnosis not present

## 2015-02-03 DIAGNOSIS — Z8551 Personal history of malignant neoplasm of bladder: Secondary | ICD-10-CM | POA: Insufficient documentation

## 2015-02-03 DIAGNOSIS — Z87891 Personal history of nicotine dependence: Secondary | ICD-10-CM | POA: Diagnosis not present

## 2015-02-03 DIAGNOSIS — Z79899 Other long term (current) drug therapy: Secondary | ICD-10-CM | POA: Insufficient documentation

## 2015-02-03 DIAGNOSIS — I714 Abdominal aortic aneurysm, without rupture: Secondary | ICD-10-CM | POA: Diagnosis not present

## 2015-02-03 DIAGNOSIS — M4804 Spinal stenosis, thoracic region: Secondary | ICD-10-CM | POA: Insufficient documentation

## 2015-02-03 DIAGNOSIS — Z7982 Long term (current) use of aspirin: Secondary | ICD-10-CM | POA: Diagnosis not present

## 2015-02-03 HISTORY — DX: Unspecified osteoarthritis, unspecified site: M19.90

## 2015-02-03 HISTORY — DX: Abdominal aortic aneurysm, without rupture: I71.4

## 2015-02-03 HISTORY — DX: Myoneural disorder, unspecified: G70.9

## 2015-02-03 HISTORY — DX: Abdominal aortic aneurysm, without rupture, unspecified: I71.40

## 2015-02-03 LAB — BASIC METABOLIC PANEL
Anion gap: 8 (ref 5–15)
BUN: 25 mg/dL — AB (ref 6–20)
CALCIUM: 9 mg/dL (ref 8.9–10.3)
CO2: 24 mmol/L (ref 22–32)
CREATININE: 1.82 mg/dL — AB (ref 0.61–1.24)
Chloride: 107 mmol/L (ref 101–111)
GFR calc non Af Amer: 31 mL/min — ABNORMAL LOW (ref >60)
GFR, EST AFRICAN AMERICAN: 36 mL/min — AB (ref >60)
Glucose, Bld: 174 mg/dL — ABNORMAL HIGH (ref 65–99)
Potassium: 4.5 mmol/L (ref 3.5–5.1)
SODIUM: 139 mmol/L (ref 135–145)

## 2015-02-03 LAB — CBC
HCT: 36.2 % — ABNORMAL LOW (ref 39.0–52.0)
Hemoglobin: 11.9 g/dL — ABNORMAL LOW (ref 13.0–17.0)
MCH: 30.5 pg (ref 26.0–34.0)
MCHC: 32.9 g/dL (ref 30.0–36.0)
MCV: 92.8 fL (ref 78.0–100.0)
PLATELETS: 136 10*3/uL — AB (ref 150–400)
RBC: 3.9 MIL/uL — AB (ref 4.22–5.81)
RDW: 13.8 % (ref 11.5–15.5)
WBC: 6.2 10*3/uL (ref 4.0–10.5)

## 2015-02-03 LAB — SURGICAL PCR SCREEN
MRSA, PCR: NEGATIVE
Staphylococcus aureus: NEGATIVE

## 2015-02-03 LAB — GLUCOSE, CAPILLARY: GLUCOSE-CAPILLARY: 180 mg/dL — AB (ref 65–99)

## 2015-02-03 NOTE — Progress Notes (Signed)
Chart will go to Anesth. Consult, (no cardiac history) but based on notes in chart & EKG done in 10/2014, its thought that it warrants a review.  Based on BS reported by wife of pt. , the insulin instructions are as is on d/c instructions  , morning of instructions.   Summary given to wife & pt.

## 2015-02-03 NOTE — Progress Notes (Addendum)
Anesthesia Chart Review: Patient will be a 81 year old male by 02/08/15 when he is scheduled to undergo bilateral T11-12 laminectomy by Dr. Hal Neer.  History includes former smoker, bladder cancer, DM2, HTN, BPH, CKD, arthritis. 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 He was also noted to have a 3.6 cm AAA.  He was started on prednisone with out-patient neurosurgery follow-up. PCP is Dr. Jenny Reichmann Griffin--Dr. Kritzer's office has requested medical clearance, but response is still pending. Endocrinologist is Dr. Dwyane Dee.  Meds include ASA 81 mg, Alphagan and Cosopt ophthalmic, calcitriol, Aricept, doxazosin, Humulin N, Novolin R, pravastatin.  10/26/14 EKG: SB at 52 bpm, first degree AVB, T wave abnormality, consider inferior ischemia. PR interval has lengthened, and I think inferior T wave abnormality is slightly more pronounced when compared to 12-07-10 tracing, but there is motion in inferior leads which could influence interpretation.   10/26/14 MRI Lumbar spine: IMPRESSION: 1. Multifactorial degenerative changes with resultant severe canal stenosis and cord compression at the level of T11-12 as detailed above. No associated cord signal changes. 2. 4 mm anterolisthesis of L4 on L5 with associated disc bulge and severe facet arthrosis, resulting in severe canal stenosis at this level. 3. Approximately 80% of height loss at the right aspect of the superior endplate of 624THL, likely late subacute to chronic in nature. Vertebral body heights otherwise preserved. 4. Additional more mild multilevel degenerative changes as detailed above. 5. Transitional lumbosacral anatomy with partial sacralization of the L5 vertebral body. Careful correlation with the numbering system on this exam and prior plain film radiographs recommended prior to any potential future surgical intervention. 6. Intra-abdominal aortic aneurysm  measuring up to 3.6 cm in diameter.  10/26/14 CT head: IMPRESSION: Atrophy and chronic ischemic white matter disease without acute intracranial abnormality.  Preoperative labs noted. BUN 25, Cr 1.82 which appear stable since at least 08/2014. Glucose 174. H/H 11.9/36.2, PLT 136. A1C pending (last 6.3 on 08/24/14).  Reviewed above with anesthesiologist Dr. Linna Caprice. He wants to touch base with Dr. Laurann Montana regarding consideration of pre-operative echo.   [facility]/Anesthesiology Phone [phone] 02/03/2015 5:22 PM  Addendum: Dr. Linna Caprice spoke with Dr. Laurann Montana. Dr. Laurann Montana is seeing patient late this afternoon. He was asked to review recent EKG and labs. He will determine clearance status following patient's evaluation. Dr. Linna Caprice asked Dr. Laurann Montana to call me to update. (Update: Dr. Lavone Orn called. He evaluated patient and repeated EKG today (and compared it to 10/2014 tracing). He is clearing patient for surgery tomorrow with no additional testing recommended as patient was asymptomatic from a CV standpoint.)  [facility] Short Stay Center/Anesthesiology Phone [phone] 02/07/2015 5:20 PM

## 2015-02-03 NOTE — Pre-Procedure Instructions (Addendum)
[place]  02/03/2015      [id number] [address], [place] [address] [address] Phone: [phone] Fax: [phone]    Your procedure is scheduled on 02/08/2015 .  Report to [facility] Admitting at   : 8:30A.M.  Call this number if you have problems the morning of surgery:  [phone]   Remember:  Do not eat food or drink liquids after midnight.  ON Monday    Take these medicines the morning of surgery with A SIP OF WATER :  ONLY 15 UNITS of "N" Insulin.........NO. REGULAR  "R"- INSULIN   Do not wear jewelry   Do not wear lotions, powders, or perfumes.  You may wear deodorant.              Men may shave face and neck.   Do not bring valuables to the hospital.   [facility] is not responsible for any belongings or valuables.  Contacts, dentures or bridgework may not be worn into surgery.  Leave your suitcase in the car.  After surgery it may be brought to your room.  For patients admitted to the hospital, discharge time will be determined by your treatment team.  Patients discharged the day of surgery will not be allowed to drive home.   Name and phone number of your driver:   /with family  Special instructions: How to Manage Your Diabetes Before Surgery   Why is it important to control my blood sugar before and after surgery?   Improving blood sugar levels before and after surgery helps healing and can limit problems.  A way of improving blood sugar control is eating a healthy diet by:  - Eating less sugar and carbohydrates  - Increasing activity/exercise  - Talk with your doctor about reaching your blood sugar goals  High blood sugars (greater than 180 mg/dL) can raise your risk of infections and slow down your recovery so you will need to focus on controlling your diabetes during the weeks before surgery.  Make sure that the doctor who takes care of your diabetes knows about your  planned surgery including the date and location.  How do I manage my blood sugars before surgery?   Check your blood sugar at least 4 times a day, 2 days before surgery to make sure that they are not too high or low.   Check your blood sugar the morning of your surgery when you wake up and every 2               hours until you get to the Short-Stay unit.  If your blood sugar is less than 70 mg/dL, you will need to treat for low blood sugar by:  Treat a low blood sugar (less than 70 mg/dL) with 1/2 cup of clear juice (cranberry or apple), 4 glucose tablets, OR glucose gel.  Recheck blood sugar in 15 minutes after treatment (to make sure it is greater than 70 mg/dL).  If blood sugar is not greater than 70 mg/dL on re-check, call [phone] for further instructions.   Report your blood sugar to the Short-Stay nurse when you get to Short-Stay.  References:  [facility], 2007 "How to Manage your Diabetes Before and After Surgery".  What do I do about my diabetes medications?   THE NIGHT BEFORE SURGERY, take 18 units of "N" Insulin.    THE MORNING OF SURGERY, take  15 units of   "N" Insulin.   DO NOT TAKE ANY "R" INSULIN the night before surgery or the morning of surgery               Please read over the following fact sheets that you were given. Pain Booklet, Coughing and Deep Breathing, MRSA Information and Surgical Site Infection Prevention      Refer to cleansing instructions night before surgery & morning of surgery

## 2015-02-04 LAB — HEMOGLOBIN A1C
HEMOGLOBIN A1C: 7.3 % — AB (ref 4.8–5.6)
MEAN PLASMA GLUCOSE: 163 mg/dL

## 2015-02-07 MED ORDER — DEXAMETHASONE SODIUM PHOSPHATE 10 MG/ML IJ SOLN
10.0000 mg | INTRAMUSCULAR | Status: AC
  Administered 2015-02-08: 10 mg via INTRAVENOUS
  Filled 2015-02-07: qty 1

## 2015-02-07 MED ORDER — CEFAZOLIN SODIUM-DEXTROSE 2-3 GM-% IV SOLR
2.0000 g | INTRAVENOUS | Status: AC
  Administered 2015-02-08: 2 g via INTRAVENOUS
  Filled 2015-02-07: qty 50

## 2015-02-07 NOTE — Anesthesia Preprocedure Evaluation (Addendum)
Anesthesia Evaluation  Patient identified by MRN, date of birth, ID band Patient awake    Reviewed: Allergy & Precautions, NPO status , Patient's Chart, lab work & pertinent test results  History of Anesthesia Complications Negative for: history of anesthetic complications  Airway Mallampati: II  TM Distance: >3 FB Neck ROM: Full    Dental  (+) Teeth Intact   Pulmonary former smoker,    breath sounds clear to auscultation       Cardiovascular hypertension, + Peripheral Vascular Disease   Rhythm:Regular     Neuro/Psych  Neuromuscular disease negative psych ROS   GI/Hepatic negative GI ROS, Neg liver ROS,   Endo/Other  diabetes, Type 2, Insulin Dependent  Renal/GU Renal InsufficiencyRenal disease     Musculoskeletal  (+) Arthritis ,   Abdominal   Peds  Hematology  (+) anemia ,   Anesthesia Other Findings   Reproductive/Obstetrics                            Anesthesia Physical Anesthesia Plan  ASA: III  Anesthesia Plan: General   Post-op Pain Management:    Induction: Intravenous  Airway Management Planned: Oral ETT  Additional Equipment: None  Intra-op Plan:   Post-operative Plan: Extubation in OR  Informed Consent: I have reviewed the patients History and Physical, chart, labs and discussed the procedure including the risks, benefits and alternatives for the proposed anesthesia with the patient or authorized representative who has indicated his/her understanding and acceptance.   Dental advisory given  Plan Discussed with: CRNA and Surgeon  Anesthesia Plan Comments:         Anesthesia Quick Evaluation

## 2015-02-08 ENCOUNTER — Inpatient Hospital Stay (HOSPITAL_COMMUNITY): Payer: 59 | Admitting: Vascular Surgery

## 2015-02-08 ENCOUNTER — Encounter (HOSPITAL_COMMUNITY): Payer: Self-pay | Admitting: *Deleted

## 2015-02-08 ENCOUNTER — Inpatient Hospital Stay (HOSPITAL_COMMUNITY): Payer: 59 | Admitting: Anesthesiology

## 2015-02-08 ENCOUNTER — Inpatient Hospital Stay (HOSPITAL_COMMUNITY): Payer: 59

## 2015-02-08 ENCOUNTER — Encounter (HOSPITAL_COMMUNITY)
Admission: RE | Disposition: A | Discharge status: Home / Self Care | Payer: Self-pay | Source: Ambulatory Visit | Attending: Neurosurgery

## 2015-02-08 ENCOUNTER — Observation Stay (HOSPITAL_COMMUNITY)
Admission: RE | Admit: 2015-02-08 | Discharge: 2015-02-09 | Disposition: A | Discharge status: Home / Self Care | Payer: 59 | Source: Ambulatory Visit | Attending: Neurosurgery | Admitting: Neurosurgery

## 2015-02-08 DIAGNOSIS — Z7982 Long term (current) use of aspirin: Secondary | ICD-10-CM | POA: Insufficient documentation

## 2015-02-08 DIAGNOSIS — Z794 Long term (current) use of insulin: Secondary | ICD-10-CM | POA: Diagnosis not present

## 2015-02-08 DIAGNOSIS — N400 Enlarged prostate without lower urinary tract symptoms: Secondary | ICD-10-CM | POA: Diagnosis not present

## 2015-02-08 DIAGNOSIS — I129 Hypertensive chronic kidney disease with stage 1 through stage 4 chronic kidney disease, or unspecified chronic kidney disease: Secondary | ICD-10-CM | POA: Insufficient documentation

## 2015-02-08 DIAGNOSIS — I714 Abdominal aortic aneurysm, without rupture: Secondary | ICD-10-CM | POA: Insufficient documentation

## 2015-02-08 DIAGNOSIS — G9619 Other disorders of meninges, not elsewhere classified: Secondary | ICD-10-CM | POA: Diagnosis not present

## 2015-02-08 DIAGNOSIS — N189 Chronic kidney disease, unspecified: Secondary | ICD-10-CM | POA: Insufficient documentation

## 2015-02-08 DIAGNOSIS — M4804 Spinal stenosis, thoracic region: Principal | ICD-10-CM | POA: Insufficient documentation

## 2015-02-08 DIAGNOSIS — E1122 Type 2 diabetes mellitus with diabetic chronic kidney disease: Secondary | ICD-10-CM | POA: Insufficient documentation

## 2015-02-08 DIAGNOSIS — M4806 Spinal stenosis, lumbar region: Secondary | ICD-10-CM | POA: Diagnosis not present

## 2015-02-08 DIAGNOSIS — Z87891 Personal history of nicotine dependence: Secondary | ICD-10-CM | POA: Insufficient documentation

## 2015-02-08 DIAGNOSIS — M1388 Other specified arthritis, other site: Secondary | ICD-10-CM | POA: Insufficient documentation

## 2015-02-08 DIAGNOSIS — M48062 Spinal stenosis, lumbar region with neurogenic claudication: Secondary | ICD-10-CM | POA: Diagnosis present

## 2015-02-08 DIAGNOSIS — Z8551 Personal history of malignant neoplasm of bladder: Secondary | ICD-10-CM | POA: Insufficient documentation

## 2015-02-08 DIAGNOSIS — M549 Dorsalgia, unspecified: Secondary | ICD-10-CM

## 2015-02-08 DIAGNOSIS — E1151 Type 2 diabetes mellitus with diabetic peripheral angiopathy without gangrene: Secondary | ICD-10-CM | POA: Insufficient documentation

## 2015-02-08 HISTORY — PX: THORACIC DISCECTOMY: SHX6113

## 2015-02-08 LAB — GLUCOSE, CAPILLARY
GLUCOSE-CAPILLARY: 74 mg/dL (ref 65–99)
GLUCOSE-CAPILLARY: 218 mg/dL — AB (ref 65–99)
Glucose-Capillary: 93 mg/dL (ref 65–99)
Glucose-Capillary: 252 mg/dL — ABNORMAL HIGH (ref 65–99)

## 2015-02-08 SURGERY — THORACIC DISCECTOMY
Anesthesia: General | Site: Back | Laterality: Bilateral

## 2015-02-08 MED ORDER — PHENOL 1.4 % MT LIQD: 1.0000 | OROMUCOSAL | Status: DC | PRN

## 2015-02-08 MED ORDER — PHENYLEPHRINE HCL 10 MG/ML IJ SOLN
INTRAMUSCULAR | Status: DC | PRN
  Administered 2015-02-08: 40 ug via INTRAVENOUS

## 2015-02-08 MED ORDER — SUCCINYLCHOLINE CHLORIDE 20 MG/ML IJ SOLN
INTRAMUSCULAR | Status: AC
  Filled 2015-02-08: qty 1

## 2015-02-08 MED ORDER — LABETALOL HCL 5 MG/ML IV SOLN
INTRAVENOUS | Status: AC
  Filled 2015-02-08: qty 4

## 2015-02-08 MED ORDER — SODIUM CHLORIDE 0.9 % IJ SOLN: 3.0000 mL | INTRAMUSCULAR | Status: DC | PRN

## 2015-02-08 MED ORDER — FENTANYL CITRATE (PF) 100 MCG/2ML IJ SOLN
INTRAMUSCULAR | Status: DC | PRN
  Administered 2015-02-08: 50 ug via INTRAVENOUS
  Administered 2015-02-08: 100 ug via INTRAVENOUS
  Administered 2015-02-08: 50 ug via INTRAVENOUS

## 2015-02-08 MED ORDER — ACETAMINOPHEN 160 MG/5ML PO SOLN
325.0000 mg | ORAL | Status: DC | PRN
  Filled 2015-02-08: qty 20.3

## 2015-02-08 MED ORDER — EPHEDRINE SULFATE 50 MG/ML IJ SOLN
INTRAMUSCULAR | Status: AC
  Filled 2015-02-08: qty 1

## 2015-02-08 MED ORDER — POTASSIUM CHLORIDE IN NACL 20-0.45 MEQ/L-% IV SOLN
INTRAVENOUS | Status: DC
  Filled 2015-02-08 (×3): qty 1000

## 2015-02-08 MED ORDER — LIDOCAINE HCL (CARDIAC) 20 MG/ML IV SOLN
INTRAVENOUS | Status: AC
  Filled 2015-02-08: qty 5

## 2015-02-08 MED ORDER — ONDANSETRON HCL 4 MG/2ML IJ SOLN: 4.0000 mg | INTRAMUSCULAR | Status: DC | PRN

## 2015-02-08 MED ORDER — NEOSTIGMINE METHYLSULFATE 10 MG/10ML IV SOLN
INTRAVENOUS | Status: AC
  Filled 2015-02-08: qty 1

## 2015-02-08 MED ORDER — ROCURONIUM BROMIDE 100 MG/10ML IV SOLN
INTRAVENOUS | Status: DC | PRN
  Administered 2015-02-08: 50 mg via INTRAVENOUS

## 2015-02-08 MED ORDER — 0.9 % SODIUM CHLORIDE (POUR BTL) OPTIME
TOPICAL | Status: DC | PRN
Start: 2015-02-08 — End: 2015-02-08
  Administered 2015-02-08: 1000 mL

## 2015-02-08 MED ORDER — BISACODYL 5 MG PO TBEC: 5.0000 mg | DELAYED_RELEASE_TABLET | Freq: Every day | ORAL | Status: DC | PRN

## 2015-02-08 MED ORDER — INSULIN ASPART 100 UNIT/ML ~~LOC~~ SOLN
0.0000 [IU] | Freq: Every day | SUBCUTANEOUS | Status: DC
  Administered 2015-02-08: 3 [IU] via SUBCUTANEOUS

## 2015-02-08 MED ORDER — ROCURONIUM BROMIDE 50 MG/5ML IV SOLN
INTRAVENOUS | Status: AC
  Filled 2015-02-08: qty 1

## 2015-02-08 MED ORDER — PROPOFOL 10 MG/ML IV BOLUS
INTRAVENOUS | Status: DC | PRN
  Administered 2015-02-08: 110 mg via INTRAVENOUS
  Administered 2015-02-08: 20 mg via INTRAVENOUS

## 2015-02-08 MED ORDER — LACTATED RINGERS IV SOLN
INTRAVENOUS | Status: DC
  Administered 2015-02-08 (×3): via INTRAVENOUS

## 2015-02-08 MED ORDER — SUGAMMADEX SODIUM 200 MG/2ML IV SOLN
INTRAVENOUS | Status: DC | PRN
  Administered 2015-02-08: 166.2 mg via INTRAVENOUS

## 2015-02-08 MED ORDER — HEMOSTATIC AGENTS (NO CHARGE) OPTIME
TOPICAL | Status: DC | PRN
  Administered 2015-02-08: 1 via TOPICAL

## 2015-02-08 MED ORDER — CEFAZOLIN SODIUM 1-5 GM-% IV SOLN
1.0000 g | Freq: Three times a day (TID) | INTRAVENOUS | Status: AC
  Administered 2015-02-08 – 2015-02-09 (×2): 1 g via INTRAVENOUS
  Filled 2015-02-08 (×2): qty 50

## 2015-02-08 MED ORDER — FENTANYL CITRATE (PF) 250 MCG/5ML IJ SOLN
INTRAMUSCULAR | Status: AC
  Filled 2015-02-08: qty 5

## 2015-02-08 MED ORDER — HYDROMORPHONE HCL 1 MG/ML IJ SOLN: 1.0000 mg | INTRAMUSCULAR | Status: DC | PRN

## 2015-02-08 MED ORDER — CALCITRIOL 0.25 MCG PO CAPS
0.2500 ug | ORAL_CAPSULE | Freq: Every day | ORAL | Status: DC
  Filled 2015-02-08: qty 1

## 2015-02-08 MED ORDER — PANTOPRAZOLE SODIUM 40 MG IV SOLR
40.0000 mg | Freq: Every day | INTRAVENOUS | Status: DC
  Administered 2015-02-08: 40 mg via INTRAVENOUS
  Filled 2015-02-08: qty 40

## 2015-02-08 MED ORDER — ARTIFICIAL TEARS OP OINT
TOPICAL_OINTMENT | OPHTHALMIC | Status: AC
  Filled 2015-02-08: qty 3.5

## 2015-02-08 MED ORDER — INSULIN NPH (HUMAN) (ISOPHANE) 100 UNIT/ML ~~LOC~~ SUSP
26.0000 [IU] | Freq: Every day | SUBCUTANEOUS | Status: DC
  Administered 2015-02-09: 26 [IU] via SUBCUTANEOUS
  Filled 2015-02-08: qty 10

## 2015-02-08 MED ORDER — OXYCODONE HCL 5 MG/5ML PO SOLN: 5.0000 mg | Freq: Once | ORAL | Status: DC | PRN

## 2015-02-08 MED ORDER — HYDROCODONE-ACETAMINOPHEN 5-325 MG PO TABS
1.0000 | ORAL_TABLET | ORAL | Status: DC | PRN
  Administered 2015-02-08: 1 via ORAL
  Administered 2015-02-08 – 2015-02-09 (×2): 2 via ORAL
  Filled 2015-02-08 (×2): qty 2
  Filled 2015-02-08: qty 1

## 2015-02-08 MED ORDER — BACITRACIN 50000 UNITS IM SOLR
INTRAMUSCULAR | Status: DC | PRN
  Administered 2015-02-08: 500 mL

## 2015-02-08 MED ORDER — ASPIRIN EC 81 MG PO TBEC
81.0000 mg | DELAYED_RELEASE_TABLET | Freq: Every day | ORAL | Status: DC
Start: 2015-02-08 — End: 2015-02-09
  Administered 2015-02-08: 81 mg via ORAL
  Filled 2015-02-08: qty 1

## 2015-02-08 MED ORDER — SODIUM CHLORIDE 0.9 % IJ SOLN
INTRAMUSCULAR | Status: AC
  Filled 2015-02-08: qty 10

## 2015-02-08 MED ORDER — ACETAMINOPHEN 325 MG PO TABS: 650.0000 mg | ORAL_TABLET | ORAL | Status: DC | PRN

## 2015-02-08 MED ORDER — DONEPEZIL HCL 10 MG PO TABS
10.0000 mg | ORAL_TABLET | Freq: Every day | ORAL | Status: DC
  Administered 2015-02-08: 10 mg via ORAL
  Filled 2015-02-08 (×2): qty 1

## 2015-02-08 MED ORDER — INSULIN NPH (HUMAN) (ISOPHANE) 100 UNIT/ML ~~LOC~~ SUSP: 26.0000 [IU] | Freq: Two times a day (BID) | SUBCUTANEOUS | Status: DC

## 2015-02-08 MED ORDER — DOCUSATE SODIUM 100 MG PO CAPS
100.0000 mg | ORAL_CAPSULE | Freq: Two times a day (BID) | ORAL | Status: DC
  Administered 2015-02-08: 100 mg via ORAL
  Filled 2015-02-08: qty 1

## 2015-02-08 MED ORDER — MENTHOL 3 MG MT LOZG: 1.0000 | LOZENGE | OROMUCOSAL | Status: DC | PRN

## 2015-02-08 MED ORDER — INSULIN ASPART 100 UNIT/ML ~~LOC~~ SOLN
0.0000 [IU] | Freq: Three times a day (TID) | SUBCUTANEOUS | Status: DC
  Administered 2015-02-08 – 2015-02-09 (×2): 5 [IU] via SUBCUTANEOUS

## 2015-02-08 MED ORDER — ONDANSETRON HCL 4 MG/2ML IJ SOLN
INTRAMUSCULAR | Status: DC | PRN
  Administered 2015-02-08: 4 mg via INTRAVENOUS

## 2015-02-08 MED ORDER — ACETAMINOPHEN 650 MG RE SUPP: 650.0000 mg | RECTAL | Status: DC | PRN

## 2015-02-08 MED ORDER — SODIUM CHLORIDE 0.9 % IJ SOLN
3.0000 mL | Freq: Two times a day (BID) | INTRAMUSCULAR | Status: DC
  Administered 2015-02-08 (×2): 3 mL via INTRAVENOUS

## 2015-02-08 MED ORDER — THROMBIN 5000 UNITS EX SOLR
CUTANEOUS | Status: DC | PRN
  Administered 2015-02-08 (×2): 5000 [IU] via TOPICAL

## 2015-02-08 MED ORDER — LIDOCAINE HCL (CARDIAC) 20 MG/ML IV SOLN
INTRAVENOUS | Status: DC | PRN
  Administered 2015-02-08: 100 mg via INTRAVENOUS

## 2015-02-08 MED ORDER — BRIMONIDINE TARTRATE 0.2 % OP SOLN
1.0000 [drp] | Freq: Two times a day (BID) | OPHTHALMIC | Status: DC
  Administered 2015-02-08 – 2015-02-09 (×2): 1 [drp] via OPHTHALMIC
  Filled 2015-02-08: qty 5

## 2015-02-08 MED ORDER — INSULIN NPH (HUMAN) (ISOPHANE) 100 UNIT/ML ~~LOC~~ SUSP
30.0000 [IU] | Freq: Every day | SUBCUTANEOUS | Status: DC
  Administered 2015-02-08: 30 [IU] via SUBCUTANEOUS
  Filled 2015-02-08: qty 10

## 2015-02-08 MED ORDER — INSULIN ASPART 100 UNIT/ML ~~LOC~~ SOLN
3.0000 [IU] | Freq: Three times a day (TID) | SUBCUTANEOUS | Status: DC
  Administered 2015-02-08 – 2015-02-09 (×2): 3 [IU] via SUBCUTANEOUS

## 2015-02-08 MED ORDER — DOXAZOSIN MESYLATE 8 MG PO TABS
8.0000 mg | ORAL_TABLET | Freq: Every day | ORAL | Status: DC
  Administered 2015-02-08 (×2): 8 mg via ORAL
  Filled 2015-02-08 (×3): qty 1

## 2015-02-08 MED ORDER — ACETAMINOPHEN 325 MG PO TABS: 325.0000 mg | ORAL_TABLET | ORAL | Status: DC | PRN

## 2015-02-08 MED ORDER — ONDANSETRON HCL 4 MG/2ML IJ SOLN
INTRAMUSCULAR | Status: AC
  Filled 2015-02-08: qty 2

## 2015-02-08 MED ORDER — LABETALOL HCL 5 MG/ML IV SOLN
5.0000 mg | Freq: Once | INTRAVENOUS | Status: AC
  Administered 2015-02-08: 5 mg via INTRAVENOUS

## 2015-02-08 MED ORDER — PROPOFOL 10 MG/ML IV BOLUS
INTRAVENOUS | Status: AC
  Filled 2015-02-08: qty 40

## 2015-02-08 MED ORDER — FENTANYL CITRATE (PF) 100 MCG/2ML IJ SOLN: 25.0000 ug | INTRAMUSCULAR | Status: DC | PRN

## 2015-02-08 MED ORDER — BUPIVACAINE HCL (PF) 0.5 % IJ SOLN
INTRAMUSCULAR | Status: DC | PRN
  Administered 2015-02-08: 20 mL

## 2015-02-08 MED ORDER — BRIMONIDINE TARTRATE 0.15 % OP SOLN
1.0000 [drp] | Freq: Two times a day (BID) | OPHTHALMIC | Status: DC
Start: 2015-02-08 — End: 2015-02-08
  Filled 2015-02-08: qty 5

## 2015-02-08 MED ORDER — SUGAMMADEX SODIUM 200 MG/2ML IV SOLN
INTRAVENOUS | Status: AC
  Filled 2015-02-08: qty 2

## 2015-02-08 MED ORDER — GLYCOPYRROLATE 0.2 MG/ML IJ SOLN
INTRAMUSCULAR | Status: AC
  Filled 2015-02-08: qty 3

## 2015-02-08 MED ORDER — OXYCODONE HCL 5 MG PO TABS: 5.0000 mg | ORAL_TABLET | Freq: Once | ORAL | Status: DC | PRN

## 2015-02-08 MED ORDER — DORZOLAMIDE HCL-TIMOLOL MAL 2-0.5 % OP SOLN
1.0000 [drp] | Freq: Two times a day (BID) | OPHTHALMIC | Status: DC
  Administered 2015-02-08 – 2015-02-09 (×2): 1 [drp] via OPHTHALMIC
  Filled 2015-02-08: qty 10

## 2015-02-08 MED ORDER — ARTIFICIAL TEARS OP OINT
TOPICAL_OINTMENT | OPHTHALMIC | Status: DC | PRN
  Administered 2015-02-08: 1 via OPHTHALMIC

## 2015-02-08 SURGICAL SUPPLY — 46 items
APL SKNCLS STERI-STRIP NONHPOA (GAUZE/BANDAGES/DRESSINGS) ×1
BAG DECANTER FOR FLEXI CONT (MISCELLANEOUS) ×3 IMPLANT
BENZOIN TINCTURE PRP APPL 2/3 (GAUZE/BANDAGES/DRESSINGS) ×4 IMPLANT
BLADE CLIPPER SURG (BLADE) ×3 IMPLANT
BRUSH SCRUB EZ PLAIN DRY (MISCELLANEOUS) ×3 IMPLANT
BUR CUTTER 7.0 ROUND (BURR) ×3 IMPLANT
BUR MATCHSTICK NEURO 3.0 LAGG (BURR) ×3 IMPLANT
CANISTER SUCT 3000ML PPV (MISCELLANEOUS) ×3 IMPLANT
CLOSURE WOUND 1/2 X4 (GAUZE/BANDAGES/DRESSINGS) ×1
DRAPE LAPAROTOMY 100X72X124 (DRAPES) ×3 IMPLANT
DRAPE MICROSCOPE LEICA (MISCELLANEOUS) ×3 IMPLANT
DRAPE SURG 17X23 STRL (DRAPES) ×6 IMPLANT
DRSG OPSITE POSTOP 4X6 (GAUZE/BANDAGES/DRESSINGS) ×2 IMPLANT
DURAPREP 26ML APPLICATOR (WOUND CARE) ×3 IMPLANT
ELECT REM PT RETURN 9FT ADLT (ELECTROSURGICAL) ×3
ELECTRODE REM PT RTRN 9FT ADLT (ELECTROSURGICAL) ×1 IMPLANT
GAUZE SPONGE 4X4 12PLY STRL (GAUZE/BANDAGES/DRESSINGS) ×3 IMPLANT
GAUZE SPONGE 4X4 16PLY XRAY LF (GAUZE/BANDAGES/DRESSINGS) IMPLANT
GLOVE BIO SURGEON STRL SZ8 (GLOVE) ×2 IMPLANT
GLOVE BIO SURGEON STRL SZ8.5 (GLOVE) ×2 IMPLANT
GLOVE ECLIPSE 8.0 STRL XLNG CF (GLOVE) ×3 IMPLANT
GOWN STRL REUS W/ TWL LRG LVL3 (GOWN DISPOSABLE) IMPLANT
GOWN STRL REUS W/ TWL XL LVL3 (GOWN DISPOSABLE) ×1 IMPLANT
GOWN STRL REUS W/TWL 2XL LVL3 (GOWN DISPOSABLE) IMPLANT
GOWN STRL REUS W/TWL LRG LVL3 (GOWN DISPOSABLE)
GOWN STRL REUS W/TWL XL LVL3 (GOWN DISPOSABLE) ×3
KIT BASIN OR (CUSTOM PROCEDURE TRAY) ×3 IMPLANT
KIT ROOM TURNOVER OR (KITS) ×3 IMPLANT
LIQUID BAND (GAUZE/BANDAGES/DRESSINGS) ×3 IMPLANT
NDL SPNL 22GX3.5 QUINCKE BK (NEEDLE) ×2 IMPLANT
NEEDLE HYPO 22GX1.5 SAFETY (NEEDLE) ×3 IMPLANT
NEEDLE SPNL 22GX3.5 QUINCKE BK (NEEDLE) ×6 IMPLANT
NS IRRIG 1000ML POUR BTL (IV SOLUTION) ×3 IMPLANT
PACK LAMINECTOMY NEURO (CUSTOM PROCEDURE TRAY) ×3 IMPLANT
PAD ARMBOARD 7.5X6 YLW CONV (MISCELLANEOUS) ×9 IMPLANT
PATTIES SURGICAL .75X.75 (GAUZE/BANDAGES/DRESSINGS) ×3 IMPLANT
RUBBERBAND STERILE (MISCELLANEOUS) ×6 IMPLANT
SPONGE SURGIFOAM ABS GEL SZ50 (HEMOSTASIS) ×3 IMPLANT
STRIP CLOSURE SKIN 1/2X4 (GAUZE/BANDAGES/DRESSINGS) ×2 IMPLANT
SUT VIC AB 0 CT1 18XCR BRD8 (SUTURE) IMPLANT
SUT VIC AB 0 CT1 8-18 (SUTURE) ×3
SUT VIC AB 2-0 OS6 18 (SUTURE) ×9 IMPLANT
SUT VIC AB 3-0 CP2 18 (SUTURE) ×3 IMPLANT
TOWEL OR 17X24 6PK STRL BLUE (TOWEL DISPOSABLE) ×3 IMPLANT
TOWEL OR 17X26 10 PK STRL BLUE (TOWEL DISPOSABLE) ×3 IMPLANT
WATER STERILE IRR 1000ML POUR (IV SOLUTION) ×3 IMPLANT

## 2015-02-08 NOTE — H&P (Signed)
Alexis Lopez is an 81 y.o. male.   Chief Complaint: Gait difficulty HPI: The patient is a very active 81 year old gentleman who was initially seen in the hospital and then has been seen in the office and number of times. He has been using a treadmill an exercise bicycle on a regular basis but back in the summer he fell and after a few weeks developed progressive difficulty with his gait. He was treated conservatively and discharged to rehabilitation center for aggressive physical therapy and was then followed up in the office numerous times. He felt that he was getting some slow improvement but then things plateaued. He was somewhat better than initially after he fell but he is nowhere near back to his baseline. He has to use a walker now and cannot do a treadmill an exercise bike: Walk short distances. He does not have any pain to speak of and only has the weakness. His films were reviewed which showed multifactorial stenosis at T11 and T12 as well as a grade 1 spondylolisthesis with stenosis at L4-5. It was felt that because his situation was without pain and mostly just weakness of the abnormality at T11-12 was most likely his medical problem. The options were discussed and the patient requested surgery. We had a long discussion about his age and the risks associated with that but eventually we all felt that surgery was a reasonable course to follow and now comes for a decompression at T11-12. I've had a long discussion with him regarding the risks and benefits of surgical intervention. The risks discussed include but are not limited to bleeding and infection weakness and numbness paralysis trouble with bowel and bladder function spinal fluid leakage lack of improvement, and death. We have discussed alternative methods of therapy all the risks and benefits of not intervention. He's had the opportunity to rest numerous questions and appears to understand. With this information in hand he has requested we  proceed with surgery.  Past Medical History  Diagnosis Date  . BPH (benign prostatic hyperplasia)   . Bladder cancer ([place])   . Diabetes mellitus without complication ([place])   . Hypertension   . Chronic kidney disease   . Neuromuscular disorder ([place])     stenosis -back  . Arthritis     legs  . AAA (abdominal aortic aneurysm) ([facility])     3.6 cm by 10/2014 MRI    Past Surgical History  Procedure Laterality Date  . Eye surgery      Cataract  . Appendectomy    . Tonsillectomy      Family History  Problem Relation Age of Onset  . Diabetes Sister    Social History:  reports that he has quit smoking. He started smoking about 46 years ago. He has never used smokeless tobacco. He reports that he does not drink alcohol or use illicit drugs.  Allergies: No Known Allergies  Medications Prior to Admission  Medication Sig Dispense Refill  . ACCU-CHEK FASTCLIX LANCETS MISC Use to check blood sugars 2 times per day 100 each 5  . ACCU-CHEK SMARTVIEW test strip USE TO TEST BLOOD SUGAR TWICE A DAY 100 each 3  . aspirin 81 MG tablet Take 81 mg by mouth daily.    . brimonidine (ALPHAGAN) 0.15 % ophthalmic solution Place 1 drop into the right eye 2 (two) times daily.     . calcitRIOL (ROCALTROL) 0.25 MCG capsule Take 1 capsule (0.25 mcg total) by mouth daily. 90 capsule 1  . donepezil (ARICEPT)  10 MG tablet Take 10 mg by mouth at bedtime.    . dorzolamide-timolol (COSOPT) 22.3-6.8 MG/ML ophthalmic solution Place 1 drop into both eyes 2 (two) times daily.    Marland Kitchen doxazosin (CARDURA) 8 MG tablet Take 8 mg by mouth at bedtime.     . insulin NPH Human (HUMULIN N,NOVOLIN N) 100 UNIT/ML injection Inject 26-30 Units into the skin 2 (two) times daily before a meal. 30 units IN AM and 26 units in the evening    . insulin regular (NOVOLIN R,HUMULIN R) 100 units/mL injection Inject 8 Units into the skin 2 (two) times daily before a meal. Add Humulin R  For CBG 200-300 = 2 units and 301 - 400 = 4 units SQ BID     . pravastatin (PRAVACHOL) 20 MG tablet Take 20 mg by mouth daily.      Results for orders placed or performed during the hospital encounter of 02/08/15 (from the past 48 hour(s))  Glucose, capillary     Status: None   Collection Time: 02/08/15  9:12 AM  Result Value Ref Range   Glucose-Capillary 93 65 - 99 mg/dL   No results found.  Significant for the issues mentioned in the history of present illness along with diabetes and some kidney issues  Blood pressure 171/52, pulse 51, temperature 96.3 F (35.7 C), temperature source Oral, resp. rate 18, height 5\' 7"  (1.702 m), weight 83.099 kg (183 lb 3.2 oz), SpO2 99 %.  The patient is awake or and oriented. His no facial asymmetry. Gait is slow and somewhat unsteady. His strength diffusely is about 4+ to 5 minus over 5 on his lower extremities bilaterally Assessment/Plan Impression is that of multifactorial spinal stenosis with a post fall worsening of his condition. The plan is for a T11-12 decompression.  Faythe Ghee, MD 02/08/2015, 10:15 AM

## 2015-02-08 NOTE — Op Note (Signed)
Preop diagnosis: Spinal stenosis T11-12 with ligamentum cyst Postop diagnosis: Same Procedure: Bilateral T11-T12 decompressive laminectomy with removal of compressive cyst Surgeon: Manford Sprong Asst.: Arnoldo Morale  After and placed in the prone position the patient's back was prepped and draped in the usual sterile fashion. Localizing x-rays taken prior to incision to identify the appropriate level. Midline incision was made above the spinous processes of T11 and T12. Using Bovie cutting current the incision was carried on the spinous processes. Subperiosteal dissection was then carried out on the spinous processes lamina and facet joint self care tract was placed for exposure. X-ray showed approach the appropriate level. Using the Leksell rongeur spinous processes were removed. High-speed drill was used to thin the lamina and then aggressive laminectomy bilaterally at T11 and T12 were carried out. Ligamentum flavum was removed and a large cystic mass just left of midline was identified and dissected away from the underlying dura to give excellent visualization excellent decompression without breaching the dura. At this time we further decompress the lateral recess and then inspected for any evidence of residual compression and none could be identified. Irrigation was carried out and any bleeding control proper coagulation Gelfoam. The wound was then closed in multiple layers of Vicryl on the muscle fascia subcutaneous and subcuticular tissues. Dermabond and Steri-Strips were placed on the skin. A sterile dressing was then applied and the patient was extubated and taken to recovery room in stable condition.

## 2015-02-08 NOTE — Transfer of Care (Signed)
Immediate Anesthesia Transfer of Care Note  Patient: Alexis Lopez  Procedure(s) Performed: Procedure(s) with comments: Laminectomy - T11-T12 - bilateral (Bilateral) - Laminectomy - T11-T12 - bilateral  Patient Location: PACU  Anesthesia Type:General  Level of Consciousness: awake, alert , oriented and patient cooperative  Airway & Oxygen Therapy: Patient Spontanous Breathing and Patient connected to face mask oxygen  Post-op Assessment: Report given to RN, Post -op Vital signs reviewed and stable and Patient moving all extremities X 4  Post vital signs: Reviewed and stable  Last Vitals:  Filed Vitals:   02/08/15 0914  BP: 171/52  Pulse: 51  Temp: 35.7 C  Resp: 18    Complications: No apparent anesthesia complications

## 2015-02-08 NOTE — Anesthesia Procedure Notes (Signed)
Procedure Name: Intubation Date/Time: 02/08/2015 10:30 AM Performed by: Willeen Cass P Pre-anesthesia Checklist: Patient identified, Timeout performed, Emergency Drugs available, Suction available and Patient being monitored Patient Re-evaluated:Patient Re-evaluated prior to inductionOxygen Delivery Method: Circle system utilized Preoxygenation: Pre-oxygenation with 100% oxygen Intubation Type: IV induction Ventilation: Mask ventilation without difficulty Laryngoscope Size: Mac and 4 Grade View: Grade I Tube type: Oral Tube size: 7.5 mm Number of attempts: 1 Airway Equipment and Method: Stylet Placement Confirmation: ETT inserted through vocal cords under direct vision,  breath sounds checked- equal and bilateral and positive ETCO2 Secured at: 22 cm Tube secured with: Tape Dental Injury: Teeth and Oropharynx as per pre-operative assessment

## 2015-02-09 DIAGNOSIS — M4804 Spinal stenosis, thoracic region: Secondary | ICD-10-CM | POA: Diagnosis not present

## 2015-02-09 LAB — GLUCOSE, CAPILLARY: Glucose-Capillary: 206 mg/dL — ABNORMAL HIGH (ref 65–99)

## 2015-02-09 MED ORDER — HYDROCODONE-ACETAMINOPHEN 5-325 MG PO TABS: 1.0000 | ORAL_TABLET | ORAL | Status: AC | PRN

## 2015-02-09 NOTE — Progress Notes (Signed)
Patient alert and oriented, mae's well, voiding adequate amount of urine, swallowing without difficulty, no c/o pain. Patient discharged home with family. Script and discharged instructions given to patient. Patient and family stated understanding of d/c instructions given and has an appointment with MD. 

## 2015-02-09 NOTE — Discharge Summary (Signed)
  Physician Discharge Summary  Patient ID: Massie [facility] MRN: [id number] DOB/AGE: [birth year] 79 y.o.  Admit date: 02/08/2015 Discharge date: 02/09/2015  Admission Diagnoses:  Discharge Diagnoses:  Active Problems:   Lumbar stenosis with neurogenic claudication   Discharged Condition: good  Hospital Course: Surgery yesterday for T 11 12 decompression. Did very well with improvement in gait. Wound clean and dry. Home pod 1, specific instructions given.  Consults: None  Significant Diagnostic Studies: none  Treatments: surgery: T 11-12 decompression  Discharge Exam: Blood pressure 122/45, pulse 62, temperature 98.6 F (37 C), temperature source Oral, resp. rate 18, height 5\' 7"  (1.702 m), weight 83.099 kg (183 lb 3.2 oz), SpO2 100 %. Incision/Wound:clean and dry; no new neuro issues  Disposition: 03-Skilled Nursing Facility     Medication List    ASK your doctor about these medications        ACCU-CHEK FASTCLIX LANCETS Misc  Use to check blood sugars 2 times per day     ACCU-CHEK SMARTVIEW test strip  Generic drug:  glucose blood  USE TO TEST BLOOD SUGAR TWICE A DAY     aspirin 81 MG tablet  Take 81 mg by mouth daily.     brimonidine 0.15 % ophthalmic solution  Commonly known as:  ALPHAGAN  Place 1 drop into the right eye 2 (two) times daily.     calcitRIOL 0.25 MCG capsule  Commonly known as:  ROCALTROL  Take 1 capsule (0.25 mcg total) by mouth daily.     donepezil 10 MG tablet  Commonly known as:  ARICEPT  Take 10 mg by mouth at bedtime.     dorzolamide-timolol 22.3-6.8 MG/ML ophthalmic solution  Commonly known as:  COSOPT  Place 1 drop into both eyes 2 (two) times daily.     doxazosin 8 MG tablet  Commonly known as:  CARDURA  Take 8 mg by mouth at bedtime.     insulin NPH Human 100 UNIT/ML injection  Commonly known as:  HUMULIN N,NOVOLIN N  Inject 26-30 Units into the skin 2 (two) times daily before a meal. 30 units IN AM and 26 units in the  evening     insulin regular 100 units/mL injection  Commonly known as:  NOVOLIN R,HUMULIN R  Inject 8 Units into the skin 2 (two) times daily before a meal. Add Humulin R  For CBG 200-300 = 2 units and 301 - 400 = 4 units SQ BID     pravastatin 20 MG tablet  Commonly known as:  PRAVACHOL  Take 20 mg by mouth daily.         At home rest most of the time. Get up 9 or 10 times each day and take a 15 or 20 minute walk. No riding in the car and to your first postoperative appointment. If you have neck surgery you may shower from the chest down starting on the third postoperative day. If you had back surgery he may start showering on the third postoperative day with saran wrap wrapped around your incisional area 3 times. After the shower remove the saran wrap. Take pain medicine as needed and other medications as instructed. Call my office for an appointment.  SignedFaythe Ghee, MD 02/09/2015, 8:55 AM

## 2015-02-09 NOTE — Anesthesia Postprocedure Evaluation (Signed)
Anesthesia Post Note  Patient: Alexis Lopez [facility]  Procedure(s) Performed: Procedure(s) (LRB): Laminectomy - T11-T12 - bilateral (Bilateral)  Patient location during evaluation: PACU Anesthesia Type: General Level of consciousness: awake Pain management: pain level controlled Vital Signs Assessment: post-procedure vital signs reviewed and stable Respiratory status: spontaneous breathing Cardiovascular status: stable Postop Assessment: No signs of nausea or vomiting Anesthetic complications: no    Last Vitals:  Filed Vitals:   02/09/15 0400 02/09/15 0751  BP: 127/61 122/45  Pulse: 66 62  Temp: 36.9 C 37 C  Resp: 16 18    Last Pain:  Filed Vitals:   02/09/15 0752  PainSc: 2                  Zev Blue

## 2015-02-14 ENCOUNTER — Encounter (HOSPITAL_COMMUNITY): Payer: Self-pay | Admitting: Neurosurgery

## 2015-03-17 ENCOUNTER — Ambulatory Visit: Payer: 59 | Attending: Internal Medicine | Admitting: Physical Therapy

## 2015-03-17 DIAGNOSIS — M2560 Stiffness of unspecified joint, not elsewhere classified: Secondary | ICD-10-CM

## 2015-03-17 DIAGNOSIS — R262 Difficulty in walking, not elsewhere classified: Secondary | ICD-10-CM | POA: Insufficient documentation

## 2015-03-17 DIAGNOSIS — M6281 Muscle weakness (generalized): Secondary | ICD-10-CM | POA: Insufficient documentation

## 2015-03-17 DIAGNOSIS — M4806 Spinal stenosis, lumbar region: Secondary | ICD-10-CM | POA: Diagnosis present

## 2015-03-17 DIAGNOSIS — M48061 Spinal stenosis, lumbar region without neurogenic claudication: Secondary | ICD-10-CM

## 2015-03-17 DIAGNOSIS — Z9889 Other specified postprocedural states: Secondary | ICD-10-CM | POA: Diagnosis not present

## 2015-03-18 NOTE — Therapy (Signed)
[place], [place], [address] Phone: [phone]   Fax:  [phone]  Physical Therapy Evaluation  Patient Details  Name: Alexis Lopez MRN: [id number] Date of Birth: 05/11/1920 Referring Provider: Dr. Hal Neer  Encounter Date: 03/17/2015      PT End of Session - 03/18/15 0748    Visit Number 1   Number of Visits 16   Date for PT Re-Evaluation 05/12/15   Authorization Type Medicare G codes, KX at visit 15   PT Start Time 0845   PT Stop Time 0934   PT Time Calculation (min) 49 min   Activity Tolerance Patient tolerated treatment well      Past Medical History  Diagnosis Date  . BPH (benign prostatic hyperplasia)   . Bladder cancer ([facility])   . Diabetes mellitus without complication ([facility])   . Hypertension   . Chronic kidney disease   . Neuromuscular disorder ([facility])     stenosis -back  . Arthritis     legs  . AAA (abdominal aortic aneurysm) (HCC)     3.6 cm by 10/2014 MRI    Past Surgical History  Procedure Laterality Date  . Eye surgery      Cataract  . Appendectomy    . Tonsillectomy    . Thoracic discectomy Bilateral 02/08/2015    Procedure: Laminectomy - T11-T12 - bilateral;  Surgeon: Karie Chimera, MD;  Location: [facility];  Service: Neurosurgery;  Laterality: Bilateral;  Laminectomy - T11-T12 - bilateral    There were no vitals filed for this visit.  Visit Diagnosis:  Hx of decompressive lumbar laminectomy - Plan: PT plan of care cert/re-cert  Spinal stenosis of lumbar region - Plan: PT plan of care cert/re-cert  Difficulty in walking - Plan: PT plan of care cert/re-cert  Muscle weakness (generalized) - Plan: PT plan of care cert/re-cert  Joint stiffness - Plan: PT plan of care cert/re-cert      Subjective Assessment - 03/17/15 0852    Subjective Patient presents with RW.  Up until August no AD, walk on treadmill and elliptical.  Over 2 days, progressively weaker in LEs.   L3-4 stenosis and cyst T 11-12.  Had injections.  [facility]hab, then [place].  Plateaued.  Had surgery around Thanksgiving T11-12 decompressive laminectomy with cyst removal.  Went home from hospital.  No PT since surgery.  Family feels he is stronger.  Started doing home recumbent elliptical.     Patient is accompained by: Family member   Limitations Walking;Standing   How long can you walk comfortably? 20 min: has been to [facility]    Diagnostic tests pre-op   Patient Stated Goals Fall risk?  get off walker;  need someone at home;  family wants to know if can be home alone; stand straighter   Currently in Pain? Yes   Pain Score 0-No pain   Pain Location Back   Pain Frequency Intermittent   Aggravating Factors  stairs 2 steps into home, old ex machines            [facility] PT Assessment - 03/17/15 0901    Assessment   Medical Diagnosis spinal stenosis with lami and cyst removal   Referring Provider Dr. Hal Neer   Onset Date/Surgical Date 02/08/15   Hand Dominance Right   Next MD Visit Jan ?   Prior Therapy Pre-surgery   Precautions   Precautions Back   Precaution Comments no lifting, bending and twisting OK   Restrictions  Weight Bearing Restrictions No   Balance Screen   Has the patient fallen in the past 6 months Yes   How many times? 1  August   Has the patient had a decrease in activity level because of a fear of falling?  Yes   Is the patient reluctant to leave their home because of a fear of falling?  No   Home Environment   Living Environment Private residence   Living Arrangements Spouse/significant other;Children   Available Help at Discharge Family;Personal care attendant   Type of [place] to enter   Entrance Stairs-Number of Steps 2   Entrance Stairs-Rails None  post   Home Layout Two level;Laundry or work area in [employer]   Overall Cognitive Status --  takes Aricept   Attention Focused   Awareness Appears intact    Behaviors --  follows simple commands well, moderately complex 50%   Observation/Other Assessments   Focus on Therapeutic Outcomes (FOTO)  N/A   AROM   AROM Assessment Site Hip;Knee;Ankle   Right/Left Hip Right;Left   Right Hip Flexion 95   Left Hip Flexion 95   Right/Left Knee Right;Left   Right Knee Extension 4   Right Knee Flexion 115   Left Knee Extension 4   Left Knee Flexion 115   Right/Left Ankle Right;Left   Right Ankle Dorsiflexion 0   Left Ankle Dorsiflexion 0   Strength   Strength Assessment Site Hip;Knee;Ankle;Lumbar   Right/Left Hip Right;Left   Right Hip Flexion 4-/5   Right Hip Extension 3+/5   Right Hip ABduction 3+/5   Left Hip Flexion 4-/5   Left Hip Extension 3+/5   Left Hip ABduction 3+/5   Right/Left Knee Right;Left   Right Knee Flexion 4-/5   Right Knee Extension 4-/5   Left Knee Flexion 4-/5   Left Knee Extension 4-/5   Right/Left Ankle Right;Left   Right Ankle Dorsiflexion 4-/5   Right Ankle Plantar Flexion 4-/5   Right Ankle Inversion 4-/5   Right Ankle Eversion 4-/5   Left Ankle Dorsiflexion 4-/5   Left Ankle Plantar Flexion 4-/5   Left Ankle Inversion 4-/5   Left Ankle Eversion 4-/5   Lumbar Flexion 3+/5   Lumbar Extension 3+/5   Ambulation/Gait   Ambulation Distance (Feet) 150 Feet   Assistive device Rolling walker   Gait Pattern Shuffle   Gait velocity .39m/sec  10 m walk   Berg Balance Test   Sit to Stand Able to stand without using hands and stabilize independently   Standing Unsupported Able to stand 2 minutes with supervision   Sitting with Back Unsupported but Feet Supported on Floor or Stool Able to sit safely and securely 2 minutes   Stand to Sit Sits safely with minimal use of hands   Transfers Able to transfer safely, definite need of hands   Standing Unsupported with Eyes Closed Able to stand 10 seconds with supervision   Standing Ubsupported with Feet Together Able to place feet together independently and stand for 1  minute with supervision   From Standing, Reach Forward with Outstretched Arm Can reach forward >5 cm safely (2")   From Standing Position, Pick up Object from Floor Able to pick up shoe, needs supervision   From Standing Position, Turn to Look Behind Over each Shoulder Looks behind one side only/other side shows less weight shift   Turn 360 Degrees Needs close supervision or verbal cueing   Standing Unsupported,  Alternately Place Feet on Step/Stool Needs assistance to keep from falling or unable to try   Standing Unsupported, One Foot in Front Needs help to step but can hold 15 seconds   Standing on One Leg Unable to try or needs assist to prevent fall   Total Score 34                             PT Short Term Goals - 03/18/15 0758    PT SHORT TERM GOAL #1   Title The patient will be able to ambulate with RW .54m/sec with 10 meter walk indicating safer gait speed needed for community ambulation   04/14/15   Time 4   Period Weeks   Status New   PT SHORT TERM GOAL #2   Title The patient will have improved HS length to 50 degrees and hip flexor length to 5 degrees for longer stride length with ambulation  04/14/15   Time 4   Period Weeks   Status New   PT SHORT TERM GOAL #3   Title The patient will have improved BERG Balance score to 38/56 indicating decreased risk of falls    Time 4   Period Weeks   Status New           PT Long Term Goals - 03/18/15 0808    PT LONG TERM GOAL #1   Title The patient will be independent in HEP for further improvements in strength, ROM and balance   05/12/15   Time 8   Period Weeks   Status New   PT LONG TERM GOAL #2   Title The patient will have improved core and LE strength to at least 4/5 needed for household and community ambulation and mobility   Time 8   Period Weeks   Status New   PT LONG TERM GOAL #3   Title BERG balance score improved to 42/56 indicating decreased risk of falls   Time 8   Period Weeks   Status New    PT LONG TERM GOAL #4   Title Patient will be able to go up and down 2 steps with assistive device  and min assist needed to enter exit home safely   Time 8   Period Weeks   Status New   PT LONG TERM GOAL #5   Title Six minute walk test of 585 feet with appropriate assistive device to be in the 10th percentile of other 22+  year olds for safe community ambulation to church, stores and doctor's appointments   Time 8   Period Weeks   Status New               Plan - 03/18/15 0725    Clinical Impression Statement The patient is a pleasant 81 year old who presents with his wife and daughter.  His daughter reports that  up until August the patient used no assistive device, walked on treadmill and was active, staying alone while his wife worked.  They do report he walked slightly bent over.  Over 2 days, he became progressively weaker in LEs.  He was admitted to the hospital and diagnosed with L3-4 stenosis and cyst T 11-12.  Had injections and went to  [facility] rehab, then [facility].  His progress plateaued.  Had surgery on 02/08/15  T11-12 bilateral decompressive laminectomy with cyst removal.  Went home from hospital with 24 hour care.  No PT since surgery but  he has started using a home "recumbent elliptical."  Family feels he is stronger and want to know his safety with the RW and readiness for advancement to a cane.  The family reports he needs considerable assist to go up/down the 2 steps to enter/exit home.    The patient (and family) report he has had minimal pain, mostly just post-surgical soreness in his back.  The patient has decreased gait speed .55m/sec with 24m walk test with RW (.49m/sec needed for community ambulation).  His BERG balance score is 34/56 (near 100% risk for falls).  Decreased HS and hip flexor length not uncommon for a 82 year old.  LE strength grossly 4-/5.  Core strength 3/5.  Findings reviewed with the family including fall risk.  Recommend PT to address these deficits to  improve balance, gait safety with least restrictive assistive device and strength.     Pt will benefit from skilled therapeutic intervention in order to improve on the following deficits Decreased balance;Decreased activity tolerance;Decreased range of motion;Decreased mobility;Decreased strength;Difficulty walking;Pain   Rehab Potential Good   PT Frequency 2x / week   PT Duration 8 weeks   PT Treatment/Interventions ADLs/Self Care Home Management;Therapeutic exercise;Cryotherapy;Electrical Stimulation;Moist Heat;Functional mobility training;Gait training;Stair training;Patient/family education;Neuromuscular re-education;Manual techniques   PT Next Visit Plan ?Possible 6 min walk test with RW and/or TUG test; Core and LE strengthening;  standing balance exercises          G-Codes - 2015-04-15 1135    Functional Assessment Tool Used BERG, clinical judgement   Functional Limitation Mobility: Walking and moving around   Mobility: Walking and Moving Around Current Status [id number]) At least 60 percent but less than 80 percent impaired, limited or restricted   Mobility: Walking and Moving Around Goal Status [phone]) At least 40 percent but less than 60 percent impaired, limited or restricted       Problem List Patient Active Problem List   Diagnosis Date Noted  . Lumbar stenosis with neurogenic claudication 02/08/2015  . Right knee pain 11/02/2014  . Weakness 10/26/2014  . Sinus bradycardia 10/26/2014  . Diabetes mellitus type 2, controlled ([facility]) 10/26/2014  . Chronic anemia 10/26/2014  . Thrombocytopenia ([facility]) 10/26/2014  . Low back pain 10/26/2014  . Chronic kidney disease, stage III (moderate) 03/13/2013  . Essential hypertension 03/04/2013  . Other and unspecified hyperlipidemia 03/04/2013  . Diabetes mellitus, stable ([place]) 11/21/2012    Alvera Singh 03/18/2015, 11:38 AM  [facility] [address] [place], [place],  [address] Phone: [phone]   Fax:  [phone]  Name: Alexis Lopez MRN: [id number] Date of Birth: 1920/04/21   Ruben Im, PT 03/18/2015 11:39 AM Phone: [phone] Fax: [phone]

## 2015-03-22 ENCOUNTER — Ambulatory Visit: Payer: 59 | Attending: Neurosurgery

## 2015-03-22 DIAGNOSIS — M2560 Stiffness of unspecified joint, not elsewhere classified: Secondary | ICD-10-CM | POA: Insufficient documentation

## 2015-03-22 DIAGNOSIS — Z9889 Other specified postprocedural states: Secondary | ICD-10-CM | POA: Diagnosis present

## 2015-03-22 DIAGNOSIS — M6281 Muscle weakness (generalized): Secondary | ICD-10-CM | POA: Insufficient documentation

## 2015-03-22 DIAGNOSIS — M48061 Spinal stenosis, lumbar region without neurogenic claudication: Secondary | ICD-10-CM

## 2015-03-22 DIAGNOSIS — R262 Difficulty in walking, not elsewhere classified: Secondary | ICD-10-CM | POA: Diagnosis present

## 2015-03-22 DIAGNOSIS — M4806 Spinal stenosis, lumbar region: Secondary | ICD-10-CM | POA: Diagnosis not present

## 2015-03-22 NOTE — Therapy (Signed)
[place], [place], [address] Phone: [phone]   Fax:  [phone]  Physical Therapy Treatment  Patient Details  Name: Alexis Lopez MRN: [id number] Date of Birth: 04-30-1920 Referring Provider: Dr. Hal Neer  Encounter Date: 03/22/2015      PT End of Session - 03/22/15 0926    Visit Number 2   Number of Visits 16   Date for PT Re-Evaluation 05/12/15   PT Start Time 0845   PT Stop Time 0930   PT Time Calculation (min) 45 min   Activity Tolerance Patient tolerated treatment well   Behavior During Therapy [facility] for tasks assessed/performed      Past Medical History  Diagnosis Date  . BPH (benign prostatic hyperplasia)   . Bladder cancer ([facility])   . Diabetes mellitus without complication ([place])   . Hypertension   . Chronic kidney disease   . Neuromuscular disorder ([facility])     stenosis -back  . Arthritis     legs  . AAA (abdominal aortic aneurysm) ([facility])     3.6 cm by 10/2014 MRI    Past Surgical History  Procedure Laterality Date  . Eye surgery      Cataract  . Appendectomy    . Tonsillectomy    . Thoracic discectomy Bilateral 02/08/2015    Procedure: Laminectomy - T11-T12 - bilateral;  Surgeon: Karie Chimera, MD;  Location: [facility];  Service: Neurosurgery;  Laterality: Bilateral;  Laminectomy - T11-T12 - bilateral    There were no vitals filed for this visit.  Visit Diagnosis:  Spinal stenosis of lumbar region  Muscle weakness (generalized)  Difficulty in walking  Joint stiffness      Subjective Assessment - 03/22/15 0853    Subjective Legs are weak  post lumbar surgery.  Nothing has changed since last visit. He reports exercising once per day. He reports no limitations   Patient is accompained by: Family member   Currently in Pain? No/denies                         [facility] Adult PT Treatment/Exercise - 03/22/15 0856    Ambulation/Gait   Ambulation Distance  (Feet) 100 Feet   Assistive device Rolling walker   Gait Pattern Shuffle   Exercises   Exercises Knee/Hip;Ankle   Knee/Hip Exercises: Aerobic   Nustep L5 6 min LE only   Knee/Hip Exercises: Standing   Heel Raises Both;15 reps   Hip Flexion Right;Left;15 reps   Functional Squat Limitations sit to stand stand x 12 with light use of arms   Knee/Hip Exercises: Seated   Long Arc Quad Strengthening;Right;Left;15 reps   Long Arc Quad Weight 4 lbs.   Long [employer] Limitations 5 sec hold   Other Seated Knee/Hip Exercises Ball squeezes x 15    Knee/Hip Exercises: Supine   Bridges 10 reps   Bridges Limitations He reported pain withlying supine so limited to 10 reps.    Knee/Hip Exercises: Sidelying   Clams 15 reps  RT and LT  and after IR eacg hip x 12 with cues to control descent.  followed by assited hip extension with knee bent  90 degrees x 15 RT and LT                 PT Education - 03/22/15 0925    Education provided Yes   Education Details cued to use waklker to back to chair and possibilty of falling  Person(s) Educated Patient;Spouse   Methods Explanation;Demonstration   Comprehension Verbalized understanding          PT Short Term Goals - 03/18/15 0758    PT SHORT TERM GOAL #1   Title The patient will be able to ambulate with RW .70m/sec with 10 meter walk indicating safer gait speed needed for community ambulation   04/14/15   Time 4   Period Weeks   Status New   PT SHORT TERM GOAL #2   Title The patient will have improved HS length to 50 degrees and hip flexor length to 5 degrees for longer stride length with ambulation  04/14/15   Time 4   Period Weeks   Status New   PT SHORT TERM GOAL #3   Title The patient will have improved BERG Balance score to 38/56 indicating decreased risk of falls    Time 4   Period Weeks   Status New           PT Long Term Goals - 03/18/15 0808    PT LONG TERM GOAL #1   Title The patient will be independent in HEP for  further improvements in strength, ROM and balance   05/12/15   Time 8   Period Weeks   Status New   PT LONG TERM GOAL #2   Title The patient will have improved core and LE strength to at least 4/5 needed for household and community ambulation and mobility   Time 8   Period Weeks   Status New   PT LONG TERM GOAL #3   Title BERG balance score improved to 42/56 indicating decreased risk of falls   Time 8   Period Weeks   Status New   PT LONG TERM GOAL #4   Title Patient will be able to go up and down 2 steps with assistive device  and min assist needed to enter exit home safely   Time 8   Period Weeks   Status New   PT LONG TERM GOAL #5   Title Six minute walk test of 585 feet with appropriate assistive device to be in the 10th percentile of other 42+  year olds for safe community ambulation to church, stores and doctor's appointments   Time 8   Period Weeks   Status New               Plan - 03/22/15 [id number]    Clinical Impression Statement He is limited with supine exercise due to pain. Otherwise he did well.     PT Next Visit Plan Continue with strengthening. 6 min walk test, standing balance   Consulted and Agree with Plan of Care Patient;Family member/caregiver        Problem List Patient Active Problem List   Diagnosis Date Noted  . Lumbar stenosis with neurogenic claudication 02/08/2015  . Right knee pain 11/02/2014  . Weakness 10/26/2014  . Sinus bradycardia 10/26/2014  . Diabetes mellitus type 2, controlled ([facility]) 10/26/2014  . Chronic anemia 10/26/2014  . Thrombocytopenia ([place] [facility]) 10/26/2014  . Low back pain 10/26/2014  . Chronic kidney disease, stage III (moderate) 03/13/2013  . Essential hypertension 03/04/2013  . Other and unspecified hyperlipidemia 03/04/2013  . Diabetes mellitus, stable ([place]) 11/21/2012    Darrel Hoover PT 03/22/2015, 9:28 AM  [facility] [address] [place], [place],  [address] Phone: [phone]   Fax:  [phone]  Name: NICHOLE FYFFE MRN: [id number] Date of Birth: 1921-01-26

## 2015-03-24 ENCOUNTER — Ambulatory Visit: Payer: 59

## 2015-03-24 DIAGNOSIS — Z9889 Other specified postprocedural states: Secondary | ICD-10-CM

## 2015-03-24 DIAGNOSIS — M6281 Muscle weakness (generalized): Secondary | ICD-10-CM

## 2015-03-24 DIAGNOSIS — M48061 Spinal stenosis, lumbar region without neurogenic claudication: Secondary | ICD-10-CM

## 2015-03-24 DIAGNOSIS — M4806 Spinal stenosis, lumbar region: Secondary | ICD-10-CM | POA: Diagnosis not present

## 2015-03-24 DIAGNOSIS — R262 Difficulty in walking, not elsewhere classified: Secondary | ICD-10-CM

## 2015-03-24 DIAGNOSIS — M2560 Stiffness of unspecified joint, not elsewhere classified: Secondary | ICD-10-CM

## 2015-03-24 NOTE — Therapy (Signed)
[place], [place], [address] Phone: [phone]   Fax:  [phone]  Physical Therapy Treatment  Patient Details  Name: LONN LUGINBILL MRN: [id number] Date of Birth: 22-Mar-1920 Referring Provider: Dr. Hal Neer  Encounter Date: 03/24/2015      PT End of Session - 03/24/15 1023    Visit Number 3   Date for PT Re-Evaluation 05/12/15   PT Start Time 0935   PT Stop Time 1015   PT Time Calculation (min) 40 min   Activity Tolerance Patient tolerated treatment well   Behavior During Therapy [facility] for tasks assessed/performed      Past Medical History  Diagnosis Date  . BPH (benign prostatic hyperplasia)   . Bladder cancer ([place])   . Diabetes mellitus without complication ([place])   . Hypertension   . Chronic kidney disease   . Neuromuscular disorder ([place])     stenosis -back  . Arthritis     legs  . AAA (abdominal aortic aneurysm) ([facility])     3.6 cm by 10/2014 MRI    Past Surgical History  Procedure Laterality Date  . Eye surgery      Cataract  . Appendectomy    . Tonsillectomy    . Thoracic discectomy Bilateral 02/08/2015    Procedure: Laminectomy - T11-T12 - bilateral;  Surgeon: Karie Chimera, MD;  Location: [facility];  Service: Neurosurgery;  Laterality: Bilateral;  Laminectomy - T11-T12 - bilateral    There were no vitals filed for this visit.  Visit Diagnosis:  Spinal stenosis of lumbar region  Muscle weakness (generalized)  Difficulty in walking  Joint stiffness  Hx of decompressive lumbar laminectomy      Subjective Assessment - 03/24/15 0951    Subjective No new complaints.   Currently in Pain? No/denies                         [facility] Adult PT Treatment/Exercise - 03/24/15 0001    Ambulation/Gait   Gait Comments we walked with one hand hols 10 feet x 2 to get to place to hold on . His steps were short and  he was iincr cautious when stepping up and off 1/2 in  mat.Marland Kitchen  He needed cues for not reaching for chair and to turn before sitting   Neuro Re-ed    Neuro Re-ed Details  Practiced SLS each leg with one hand hold support  10 sec x 5  and 3 reps each with moving knee adduct/abduct 3 resp   Knee/Hip Exercises: Standing   Heel Raises Both;15 reps   Hip Flexion Right;Left;10 reps   with 5 pounds on ankle   Hip ADduction Right;Left;15 reps   Hip Extension Right;Left;10 reps  he reported some back pain with this   Forward Step Up Right;Left;15 reps;Hand Hold: 1;Step Height: 4"   Knee/Hip Exercises: Seated   Long Arc Quad Strengthening;Right;Left;2 sets;10 reps   Long Arc Quad Weight 5 lbs.   Long Arc Quad Limitations 2-5 sec hold   Other Seated Knee/Hip Exercises Ball squeezes x 20    Abduction/Adduction  Both;1 set  25 reps   Abd/Adduction Limitations blue band 3 sec hold                  PT Short Term Goals - 03/18/15 0758    PT SHORT TERM GOAL #1   Title The patient will be able to ambulate with RW .85m/sec with 10 meter  walk indicating safer gait speed needed for community ambulation   04/14/15   Time 4   Period Weeks   Status New   PT SHORT TERM GOAL #2   Title The patient will have improved HS length to 50 degrees and hip flexor length to 5 degrees for longer stride length with ambulation  04/14/15   Time 4   Period Weeks   Status New   PT SHORT TERM GOAL #3   Title The patient will have improved BERG Balance score to 38/56 indicating decreased risk of falls    Time 4   Period Weeks   Status New           PT Long Term Goals - 03/18/15 0808    PT LONG TERM GOAL #1   Title The patient will be independent in HEP for further improvements in strength, ROM and balance   05/12/15   Time 8   Period Weeks   Status New   PT LONG TERM GOAL #2   Title The patient will have improved core and LE strength to at least 4/5 needed for household and community ambulation and mobility   Time 8   Period Weeks   Status New   PT LONG  TERM GOAL #3   Title BERG balance score improved to 42/56 indicating decreased risk of falls   Time 8   Period Weeks   Status New   PT LONG TERM GOAL #4   Title Patient will be able to go up and down 2 steps with assistive device  and min assist needed to enter exit home safely   Time 8   Period Weeks   Status New   PT LONG TERM GOAL #5   Title Six minute walk test of 585 feet with appropriate assistive device to be in the 10th percentile of other 84+  year olds for safe community ambulation to church, stores and doctor's appointments   Time 8   Period Weeks   Status New               Plan - 03/24/15 1024    Clinical Impression Statement He did well with all exercise but he is unsteady on feet and need close supervision/asssist when on feet with exercise   PT Next Visit Plan Continue with strengthening. 6 min walk test, standing balance   Consulted and Agree with Plan of Care Patient;Family member/caregiver        Problem List Patient Active Problem List   Diagnosis Date Noted  . Lumbar stenosis with neurogenic claudication 02/08/2015  . Right knee pain 11/02/2014  . Weakness 10/26/2014  . Sinus bradycardia 10/26/2014  . Diabetes mellitus type 2, controlled ([place]) 10/26/2014  . Chronic anemia 10/26/2014  . Thrombocytopenia ([place]) 10/26/2014  . Low back pain 10/26/2014  . Chronic kidney disease, stage III (moderate) 03/13/2013  . Essential hypertension 03/04/2013  . Other and unspecified hyperlipidemia 03/04/2013  . Diabetes mellitus, stable ([place]) 11/21/2012    Darrel Hoover PT 03/24/2015, 10:25 AM  [facility] [address] [place], [place], [address] Phone: [phone]   Fax:  [phone]  Name: JAIREN TREMBLY MRN: [id number] Date of Birth: [birth year]

## 2015-03-28 ENCOUNTER — Ambulatory Visit: Payer: 59 | Admitting: Physical Therapy

## 2015-03-30 ENCOUNTER — Ambulatory Visit: Payer: 59

## 2015-03-30 DIAGNOSIS — R262 Difficulty in walking, not elsewhere classified: Secondary | ICD-10-CM

## 2015-03-30 DIAGNOSIS — M4806 Spinal stenosis, lumbar region: Secondary | ICD-10-CM | POA: Diagnosis not present

## 2015-03-30 DIAGNOSIS — M6281 Muscle weakness (generalized): Secondary | ICD-10-CM

## 2015-03-30 DIAGNOSIS — M2560 Stiffness of unspecified joint, not elsewhere classified: Secondary | ICD-10-CM

## 2015-03-30 DIAGNOSIS — M48061 Spinal stenosis, lumbar region without neurogenic claudication: Secondary | ICD-10-CM

## 2015-03-30 NOTE — Therapy (Signed)
[place], [place], [address] Phone: [phone]   Fax:  [phone]  Physical Therapy Treatment  Patient Details  Name: Alexis Lopez MRN: [id number] Date of Birth: 1920-07-13 Referring Provider: Dr. Hal Neer  Encounter Date: 03/30/2015      PT End of Session - 03/30/15 1025    Visit Number 4   Number of Visits 16   Date for PT Re-Evaluation 05/12/15   PT Start Time 0930   PT Stop Time 1020   PT Time Calculation (min) 50 min   Activity Tolerance Patient tolerated treatment well   Behavior During Therapy [facility] for tasks assessed/performed      Past Medical History  Diagnosis Date  . BPH (benign prostatic hyperplasia)   . Bladder cancer ([place])   . Diabetes mellitus without complication ([place])   . Hypertension   . Chronic kidney disease   . Neuromuscular disorder ([place])     stenosis -back  . Arthritis     legs  . AAA (abdominal aortic aneurysm) (HCC)     3.6 cm by 10/2014 MRI    Past Surgical History  Procedure Laterality Date  . Eye surgery      Cataract  . Appendectomy    . Tonsillectomy    . Thoracic discectomy Bilateral 02/08/2015    Procedure: Laminectomy - T11-T12 - bilateral;  Surgeon: Karie Chimera, MD;  Location: [place] [facility];  Service: Neurosurgery;  Laterality: Bilateral;  Laminectomy - T11-T12 - bilateral    There were no vitals filed for this visit.  Visit Diagnosis:  Spinal stenosis of lumbar region  Muscle weakness (generalized)  Difficulty in walking  Joint stiffness      Subjective Assessment - 03/30/15 0939    Subjective No pain Reports doing OK.   Patient is accompained by: Family member   Currently in Pain? No/denies                         [facility] Adult PT Treatment/Exercise - 03/30/15 0940    Ambulation/Gait   Ambulation Distance (Feet) 50 Feet  x2   Assistive device Straight cane   Gait Pattern Shuffle   Ambulation Surface Level;Indoor   Gait velocity .50m/sec with SPC +1 CG  with RW .38 m/sec   Gait Comments When asked he was able to increase step length. He commented he felt weak.  After 2  walks of 50 feet he did 2 walks of  180 feet.Marland Kitchen He reported faigue post but HR only 85 BPM   Knee/Hip Exercises: Aerobic   Nustep L7  6 min LE only   Knee/Hip Exercises: Standing   Knee Flexion Right;Left;10 reps  4 pounds   Hip Flexion Right;Left  12 reps   Hip Flexion Limitations 4 pounds on ankles   Hip Abduction Right;Left;15 reps   Abduction Limitations 4 pound on ankles   Hip Extension Right;Left  12 reps 4 pounds   Knee/Hip Exercises: Seated   Long Arc Quad Right;Left;20 reps   Long Arc Quad Weight 5 lbs.                  PT Short Term Goals - 03/30/15 1029    PT SHORT TERM GOAL #1   Title The patient will be able to ambulate with RW .50m/sec with 10 meter walk indicating safer gait speed needed for community ambulation   04/14/15   Baseline .35m/sec   Status On-going   PT SHORT  TERM GOAL #2   Title The patient will have improved HS length to 50 degrees and hip flexor length to 5 degrees for longer stride length with ambulation  04/14/15   Status On-going   PT SHORT TERM GOAL #3   Title The patient will have improved BERG Balance score to 38/56 indicating decreased risk of falls    Status Unable to assess           PT Long Term Goals - 03/18/15 0808    PT LONG TERM GOAL #1   Title The patient will be independent in HEP for further improvements in strength, ROM and balance   05/12/15   Time 8   Period Weeks   Status New   PT LONG TERM GOAL #2   Title The patient will have improved core and LE strength to at least 4/5 needed for household and community ambulation and mobility   Time 8   Period Weeks   Status New   PT LONG TERM GOAL #3   Title BERG balance score improved to 42/56 indicating decreased risk of falls   Time 8   Period Weeks   Status New   PT LONG TERM GOAL #4   Title Patient will be  able to go up and down 2 steps with assistive device  and min assist needed to enter exit home safely   Time 8   Period Weeks   Status New   PT LONG TERM GOAL #5   Title Six minute walk test of 585 feet with appropriate assistive device to be in the 10th percentile of other 38+  year olds for safe community ambulation to church, stores and doctor's appointments   Time 8   Period Weeks   Status New               Plan - 03/30/15 1026    Clinical Impression Statement No LOB or need for physical assist  with walking with SPC but needed multiple verbal cues to increased step length. He reported only minor fatigue at end of session. He is still not safe to walk without walker.    PT Next Visit Plan Continue with strengthening., standing balance, walking with SPC with CG   Consulted and Agree with Plan of Care Patient;Family member/caregiver        Problem List Patient Active Problem List   Diagnosis Date Noted  . Lumbar stenosis with neurogenic claudication 02/08/2015  . Right knee pain 11/02/2014  . Weakness 10/26/2014  . Sinus bradycardia 10/26/2014  . Diabetes mellitus type 2, controlled ([place]) 10/26/2014  . Chronic anemia 10/26/2014  . Thrombocytopenia ([place]) 10/26/2014  . Low back pain 10/26/2014  . Chronic kidney disease, stage III (moderate) 03/13/2013  . Essential hypertension 03/04/2013  . Other and unspecified hyperlipidemia 03/04/2013  . Diabetes mellitus, stable ([place]) 11/21/2012    Darrel Hoover PT 03/30/2015, 10:33 AM  [facility] [address] [place], [place], [address] Phone: [phone]   Fax:  [phone]  Name: Alexis Lopez MRN: [id number] Date of Birth: 26-Apr-1920

## 2015-04-04 ENCOUNTER — Ambulatory Visit: Payer: 59 | Admitting: Physical Therapy

## 2015-04-04 DIAGNOSIS — M4806 Spinal stenosis, lumbar region: Secondary | ICD-10-CM | POA: Diagnosis not present

## 2015-04-04 DIAGNOSIS — M6281 Muscle weakness (generalized): Secondary | ICD-10-CM

## 2015-04-04 DIAGNOSIS — M48061 Spinal stenosis, lumbar region without neurogenic claudication: Secondary | ICD-10-CM

## 2015-04-04 NOTE — Therapy (Signed)
[place], [place], [address] Phone: [phone]   Fax:  [phone]  Physical Therapy Treatment  Patient Details  Name: Alexis Lopez MRN: [id number] Date of Birth: 1920-08-12 Referring Provider: Dr. Hal Neer  Encounter Date: 04/04/2015      PT End of Session - 04/04/15 1122    Visit Number 5   Number of Visits 16   Date for PT Re-Evaluation 05/12/15   PT Start Time 0954   PT Stop Time 1020   PT Time Calculation (min) 26 min   Activity Tolerance Patient tolerated treatment well;No increased pain   Behavior During Therapy [facility] for tasks assessed/performed      Past Medical History  Diagnosis Date  . BPH (benign prostatic hyperplasia)   . Bladder cancer ([place])   . Diabetes mellitus without complication ([place])   . Hypertension   . Chronic kidney disease   . Neuromuscular disorder ([place])     stenosis -back  . Arthritis     legs  . AAA (abdominal aortic aneurysm) (HCC)     3.6 cm by 10/2014 MRI    Past Surgical History  Procedure Laterality Date  . Eye surgery      Cataract  . Appendectomy    . Tonsillectomy    . Thoracic discectomy Bilateral 02/08/2015    Procedure: Laminectomy - T11-T12 - bilateral;  Surgeon: Karie Chimera, MD;  Location: [place] [facility];  Service: Neurosurgery;  Laterality: Bilateral;  Laminectomy - T11-T12 - bilateral    There were no vitals filed for this visit.  Visit Diagnosis:  Spinal stenosis of lumbar region  Muscle weakness (generalized)      Subjective Assessment - 04/04/15 0958    Subjective No pain. No progress noted yet.   Currently in Pain? No/denies                         [facility] Adult PT Treatment/Exercise - 04/04/15 1011    Knee/Hip Exercises: Standing   Hip Flexion Limitations 4 LBS 5 X 2 sets.  CGA   Hip Abduction Right   Abduction Limitations 4 LBS, 5 X each 2 sets   Hip Extension 5 reps;1 set   Extension Limitations 0 LBS, 4 too  heavy   Other Standing Knee Exercises minisquat wit 2 side steps each way, contact guard.     Knee/Hip Exercises: Seated   Sit to Sand 5 reps  2 sets CGA, no hands                  PT Short Term Goals - 04/04/15 1124    PT SHORT TERM GOAL #1   Title The patient will be able to ambulate with RW .65msec with 10 meter walk indicating safer gait speed needed for community ambulation   04/14/15   Time 4   Period Weeks   Status Unable to assess   PT SHORT TERM GOAL #2   Title The patient will have improved HS length to 50 degrees and hip flexor length to 5 degrees for longer stride length with ambulation  04/14/15   Time 4   Period Weeks   Status Unable to assess   PT SHORT TERM GOAL #3   Title The patient will have improved BERG Balance score to 38/56 indicating decreased risk of falls    Time 4   Period Weeks   Status Unable to assess  PT Long Term Goals - 03/18/15 0808    PT LONG TERM GOAL #1   Title The patient will be independent in HEP for further improvements in strength, ROM and balance   05/12/15   Time 8   Period Weeks   Status New   PT LONG TERM GOAL #2   Title The patient will have improved core and LE strength to at least 4/5 needed for household and community ambulation and mobility   Time 8   Period Weeks   Status New   PT LONG TERM GOAL #3   Title BERG balance score improved to 42/56 indicating decreased risk of falls   Time 8   Period Weeks   Status New   PT LONG TERM GOAL #4   Title Patient will be able to go up and down 2 steps with assistive device  and min assist needed to enter exit home safely   Time 8   Period Weeks   Status New   PT LONG TERM GOAL #5   Title Six minute walk test of 585 feet with appropriate assistive device to be in the 10th percentile of other 42+  year olds for safe community ambulation to church, stores and doctor's appointments   Time 8   Period Weeks   Status New               Plan - 04/04/15  1123    Clinical Impression Statement Short session.  Patient's driver got lost.  Strengthening focus.  CGA for all exercises or Close SBA.  No new goals met.   PT Next Visit Plan Continue with strengthening., standing balance, walking with SPC with CG   PT Home Exercise Plan continue   Consulted and Agree with Plan of Care Patient        Problem List Patient Active Problem List   Diagnosis Date Noted  . Lumbar stenosis with neurogenic claudication 02/08/2015  . Right knee pain 11/02/2014  . Weakness 10/26/2014  . Sinus bradycardia 10/26/2014  . Diabetes mellitus type 2, controlled ([place]) 10/26/2014  . Chronic anemia 10/26/2014  . Thrombocytopenia ([place]) 10/26/2014  . Low back pain 10/26/2014  . Chronic kidney disease, stage III (moderate) 03/13/2013  . Essential hypertension 03/04/2013  . Other and unspecified hyperlipidemia 03/04/2013  . Diabetes mellitus, stable ([place]) 11/21/2012    Kurstin Dimarzo 04/04/2015, 11:25 AM  [facility] [address] [place], [place], [address] Phone: [phone]   Fax:  [phone]  Name: SIDDARTH HSIUNG MRN: [id number] Date of Birth: [birth year]    Melvenia Needles, PTA 04/04/2015 11:25 AM Phone: [phone] Fax: [phone]

## 2015-04-06 ENCOUNTER — Ambulatory Visit: Payer: 59 | Admitting: Physical Therapy

## 2015-04-06 DIAGNOSIS — R262 Difficulty in walking, not elsewhere classified: Secondary | ICD-10-CM

## 2015-04-06 DIAGNOSIS — M4806 Spinal stenosis, lumbar region: Secondary | ICD-10-CM | POA: Diagnosis not present

## 2015-04-06 DIAGNOSIS — M2560 Stiffness of unspecified joint, not elsewhere classified: Secondary | ICD-10-CM

## 2015-04-06 DIAGNOSIS — M6281 Muscle weakness (generalized): Secondary | ICD-10-CM

## 2015-04-06 NOTE — Therapy (Signed)
[place], [place], [address] Phone: [phone]   Fax:  [phone]  Physical Therapy Treatment  Patient Details  Name: MITSUO EGLOFF MRN: [id number] Date of Birth: 10-02-1920 Referring Provider: Dr. Hal Neer  Encounter Date: 04/06/2015      PT End of Session - 04/06/15 1217    Visit Number 6   Number of Visits 16   Date for PT Re-Evaluation 05/12/15   PT Start Time 1020   PT Stop Time 1110   PT Time Calculation (min) 50 min   Activity Tolerance Patient tolerated treatment well;No increased pain   Behavior During Therapy [facility] for tasks assessed/performed      Past Medical History  Diagnosis Date  . BPH (benign prostatic hyperplasia)   . Bladder cancer ([place])   . Diabetes mellitus without complication ([place])   . Hypertension   . Chronic kidney disease   . Neuromuscular disorder ([place])     stenosis -back  . Arthritis     legs  . AAA (abdominal aortic aneurysm) (HCC)     3.6 cm by 10/2014 MRI    Past Surgical History  Procedure Laterality Date  . Eye surgery      Cataract  . Appendectomy    . Tonsillectomy    . Thoracic discectomy Bilateral 02/08/2015    Procedure: Laminectomy - T11-T12 - bilateral;  Surgeon: Karie Chimera, MD;  Location: [place] [facility];  Service: Neurosurgery;  Laterality: Bilateral;  Laminectomy - T11-T12 - bilateral    There were no vitals filed for this visit.  Visit Diagnosis:  Muscle weakness (generalized)  Difficulty in walking  Joint stiffness      Subjective Assessment - 04/06/15 1035    Subjective doing well.  Looking forward to our session                         [facility] Adult PT Treatment/Exercise - 04/06/15 1044    Ambulation/Gait   Gait Comments 1 hand in bars due to cane not available.  cues for rotations and weight shifting.  .  Pre gait activities also peformed with min assist and Cues.     High Level Balance   High Level Balance  Comments work at counter and in parallel bars.   gait belt and CGA  for safety..  Work included side steps,  walking forward and reverse .  decreased step lengths in reverse > forward at counter.  He was able to increase step lengths in bars.  weight shift and postiure improved and carried over to walker while he was in gym.     Knee/Hip Exercises: Stretches   Passive Hamstring Stretch 1 rep;30 seconds   Gastroc Stretch 3 reps;30 seconds  off 2 inch step   Knee/Hip Exercises: Standing   Forward Step Up Right;Left;2 sets;5 reps;Hand Hold: 2;Step Height: 6";Limitations   Forward Step Up Limitations CGA for safety.     Knee/Hip Exercises: Seated   Other Seated Knee/Hip Exercises seated march working on no hands and good posture 2 sets of alternating .  5    Sit to [employer] 5 reps  2 sets.  from higher mat , no hands.     Manual Therapy   Manual therapy comments 2 y"s RT knee.  to activate quads and inhibit lower leg.                  PT Education - 04/06/15 1219  Education provided Yes   Education Details gait training   Person(s) Educated Patient;Caregiver(s)   Methods Explanation   Comprehension Verbalized understanding          PT Short Term Goals - 04/06/15 1223    PT SHORT TERM GOAL #1   Title The patient will be able to ambulate with RW .32m/sec with 10 meter walk indicating safer gait speed needed for community ambulation   04/14/15   Time 4   Period Weeks   Status Unable to assess   PT SHORT TERM GOAL #2   Title The patient will have improved HS length to 50 degrees and hip flexor length to 5 degrees for longer stride length with ambulation  04/14/15   Time 4   Period Weeks   Status Unable to assess   PT SHORT TERM GOAL #3   Title The patient will have improved BERG Balance score to 38/56 indicating decreased risk of falls    Time 4   Period Weeks   Status Unable to assess           PT Long Term Goals - 04/06/15 1224    PT LONG TERM GOAL #1   Title The  patient will be independent in HEP for further improvements in strength, ROM and balance   05/12/15   Time 8   Period Weeks   Status On-going   PT LONG TERM GOAL #2   Time 8   Period Weeks   Status Unable to assess   PT LONG TERM GOAL #3   Title BERG balance score improved to 42/56 indicating decreased risk of falls   Time 8   Period Weeks   Status On-going   PT LONG TERM GOAL #4   Title Patient will be able to go up and down 2 steps with assistive device  and min assist needed to enter exit home safely   Baseline step ups in clinic, min assist    Time 8   Period Weeks   Status On-going   PT LONG TERM GOAL #5   Title Six minute walk test of 585 feet with appropriate assistive device to be in the 10th percentile of other 16+  year olds for safe community ambulation to church, stores and doctor's appointments   Time 8   Period Weeks   Status On-going               Plan - 04/06/15 1220    Clinical Impression Statement Patient had more energy today.  HR monitored intermittantly between exercises.  Gait improved with cues and walker.  Progress toward goal for steps today.     PT Next Visit Plan Continue with strengthening., standing balance, walking with SPC with CG, assess tape. Knee   PT Home Exercise Plan continue.  When you walk longer distances try longer step length.     Consulted and Agree with Plan of Care Patient        Problem List Patient Active Problem List   Diagnosis Date Noted  . Lumbar stenosis with neurogenic claudication 02/08/2015  . Right knee pain 11/02/2014  . Weakness 10/26/2014  . Sinus bradycardia 10/26/2014  . Diabetes mellitus type 2, controlled ([place]) 10/26/2014  . Chronic anemia 10/26/2014  . Thrombocytopenia ([place]) 10/26/2014  . Low back pain 10/26/2014  . Chronic kidney disease, stage III (moderate) 03/13/2013  . Essential hypertension 03/04/2013  . Other and unspecified hyperlipidemia 03/04/2013  . Diabetes mellitus, stable ([place])  11/21/2012    Dela Sweeny  04/06/2015, 12:26 PM  [place], [place], [address] Phone: [phone]   Fax:  [phone]  Name: NIO GUTTILLA MRN: [id number] Date of Birth: [birth year]    Melvenia Needles, PTA 04/06/2015 12:26 PM Phone: [phone] Fax: [phone]

## 2015-04-06 NOTE — Patient Instructions (Addendum)
Take longer step length with walking. Remove tape if irritating.

## 2015-04-08 ENCOUNTER — Other Ambulatory Visit: Payer: Self-pay | Admitting: *Deleted

## 2015-04-08 MED ORDER — GLUCOSE BLOOD VI STRP: ORAL_STRIP | Status: AC

## 2015-04-11 ENCOUNTER — Ambulatory Visit: Payer: 59

## 2015-04-11 DIAGNOSIS — M6281 Muscle weakness (generalized): Secondary | ICD-10-CM

## 2015-04-11 DIAGNOSIS — R262 Difficulty in walking, not elsewhere classified: Secondary | ICD-10-CM

## 2015-04-11 DIAGNOSIS — M48061 Spinal stenosis, lumbar region without neurogenic claudication: Secondary | ICD-10-CM

## 2015-04-11 DIAGNOSIS — M4806 Spinal stenosis, lumbar region: Secondary | ICD-10-CM | POA: Diagnosis not present

## 2015-04-11 NOTE — Therapy (Signed)
[place], [place], [address] Phone: [phone]   Fax:  [phone]  Physical Therapy Treatment  Patient Details  Name: Alexis Lopez MRN: [id number] Date of Birth: 1920/04/30 Referring Provider: Dr. Hal Neer  Encounter Date: 04/11/2015      PT End of Session - 04/11/15 1042    Visit Number 7   Number of Visits 16   Date for PT Re-Evaluation 05/12/15   PT Start Time 0930   PT Stop Time 1015   PT Time Calculation (min) 45 min   Activity Tolerance Patient tolerated treatment well   Behavior During Therapy [facility] for tasks assessed/performed      Past Medical History  Diagnosis Date  . BPH (benign prostatic hyperplasia)   . Bladder cancer ([place])   . Diabetes mellitus without complication ([place])   . Hypertension   . Chronic kidney disease   . Neuromuscular disorder ([place])     stenosis -back  . Arthritis     legs  . AAA (abdominal aortic aneurysm) (HCC)     3.6 cm by 10/2014 MRI    Past Surgical History  Procedure Laterality Date  . Eye surgery      Cataract  . Appendectomy    . Tonsillectomy    . Thoracic discectomy Bilateral 02/08/2015    Procedure: Laminectomy - T11-T12 - bilateral;  Surgeon: Karie Chimera, MD;  Location: [place] [facility];  Service: Neurosurgery;  Laterality: Bilateral;  Laminectomy - T11-T12 - bilateral    There were no vitals filed for this visit.  Visit Diagnosis:  Muscle weakness (generalized)  Difficulty in walking  Spinal stenosis of lumbar region      Subjective Assessment - 04/11/15 0955    Subjective No pain . Wife eports she feels he has more energy and can walk faster with walker. She says he uses objects to hold to  and not walker to enter bathroom  to wash up. Someone is close   Currently in Pain? No/denies            [facility] PT Assessment - 04/11/15 0001    Berg Balance Test   Sit to Stand Able to stand  independently using hands   Standing Unsupported  Able to stand 2 minutes with supervision   Sitting with Back Unsupported but Feet Supported on Floor or Stool Able to sit safely and securely 2 minutes   Stand to Sit Controls descent by using hands   Transfers Able to transfer safely, definite need of hands   Standing Unsupported with Eyes Closed Able to stand 10 seconds with supervision   Standing Ubsupported with Feet Together Needs help to attain position but able to stand for 30 seconds with feet together   From Standing, Reach Forward with Outstretched Arm Can reach forward >5 cm safely (2")   From Standing Position, Pick up Object from Floor Able to pick up shoe, needs supervision   From Standing Position, Turn to Look Behind Over each Shoulder Turn sideways only but maintains balance   Turn 360 Degrees Needs close supervision or verbal cueing   Standing Unsupported, Alternately Place Feet on Step/Stool Needs assistance to keep from falling or unable to try   Standing Unsupported, One Foot in Front Able to take small step independently and hold 30 seconds   Standing on One Leg Tries to lift leg/unable to hold 3 seconds but remains standing independently   Total Score 31   Berg comment: Foot caught on step  so needed  asssit to prevent fall                      [facility] Adult PT Treatment/Exercise - 04/11/15 0957    Ambulation/Gait   Gait Comments Walked with SPC +1 CG 45 feet x 1 then after Nustep walked with SPC    +1 CG   350 feetwith ascend/descend  8 stairs with rail and SPC and +1 CG. HR 88 bpm.  Finished with 250 feet walk with SPC. New tennis balls were added to back feet of walker   Knee/Hip Exercises: Aerobic   Nustep L5  6 min LE only     Also walked to care 200 feet with inclines and opening car door without assist add getting in car without assist.            PT Education - 04/11/15 1039    Education provided Yes   Education Details Discussed with spouse and patient he was not ready to use a cane for  walking at home. If someone felt safe and physically capable of holding him up to prevent a fall they could walk [place] at home . Asked them to walk longer distances with walker if able at home.    Person(s) Educated Patient;Spouse   Methods Explanation   Comprehension Verbalized understanding          PT Short Term Goals - 04/11/15 1041    PT SHORT TERM GOAL #3   Title The patient will have improved BERG Balance score to 38/56 indicating decreased risk of falls    Baseline 31/56 (04/11/15)   Status On-going           PT Long Term Goals - 04/06/15 1224    PT LONG TERM GOAL #1   Title The patient will be independent in HEP for further improvements in strength, ROM and balance   05/12/15   Time 8   Period Weeks   Status On-going   PT LONG TERM GOAL #2   Time 8   Period Weeks   Status Unable to assess   PT LONG TERM GOAL #3   Title BERG balance score improved to 42/56 indicating decreased risk of falls   Time 8   Period Weeks   Status On-going   PT LONG TERM GOAL #4   Title Patient will be able to go up and down 2 steps with assistive device  and min assist needed to enter exit home safely   Baseline step ups in clinic, min assist    Time 8   Period Weeks   Status On-going   PT LONG TERM GOAL #5   Title Six minute walk test of 585 feet with appropriate assistive device to be in the 10th percentile of other 14+  year olds for safe community ambulation to church, stores and doctor's appointments   Time 8   Period Weeks   Status On-going               Plan - 04/11/15 1042    Clinical Impression Statement BERG score today was worse than at eval. I discussed need for progress with spouse but we can continue for  more weeks after this and work toward goals.    PT Next Visit Plan Continue with strengthening., standing balance, walking with SPC with CG, assess tape. Knee   Consulted and Agree with Plan of Care Patient;Family member/caregiver   Family Member Consulted  spouse  Problem List Patient Active Problem List   Diagnosis Date Noted  . Lumbar stenosis with neurogenic claudication 02/08/2015  . Right knee pain 11/02/2014  . Weakness 10/26/2014  . Sinus bradycardia 10/26/2014  . Diabetes mellitus type 2, controlled ([place]) 10/26/2014  . Chronic anemia 10/26/2014  . Thrombocytopenia ([place]) 10/26/2014  . Low back pain 10/26/2014  . Chronic kidney disease, stage III (moderate) 03/13/2013  . Essential hypertension 03/04/2013  . Other and unspecified hyperlipidemia 03/04/2013  . Diabetes mellitus, stable ([place]) 11/21/2012    Darrel Hoover PT 04/11/2015, 10:46 AM  [facility] [address] [place], [place], [address] Phone: [phone]   Fax:  [phone]  Name: Alexis Lopez MRN: [id number] Date of Birth: March 26, 1920

## 2015-04-13 ENCOUNTER — Ambulatory Visit: Payer: 59 | Admitting: Physical Therapy

## 2015-04-13 ENCOUNTER — Other Ambulatory Visit: Payer: Self-pay | Admitting: Endocrinology

## 2015-04-13 DIAGNOSIS — R262 Difficulty in walking, not elsewhere classified: Secondary | ICD-10-CM

## 2015-04-13 DIAGNOSIS — M48061 Spinal stenosis, lumbar region without neurogenic claudication: Secondary | ICD-10-CM

## 2015-04-13 DIAGNOSIS — M4806 Spinal stenosis, lumbar region: Secondary | ICD-10-CM | POA: Diagnosis not present

## 2015-04-13 DIAGNOSIS — M2560 Stiffness of unspecified joint, not elsewhere classified: Secondary | ICD-10-CM

## 2015-04-13 DIAGNOSIS — M6281 Muscle weakness (generalized): Secondary | ICD-10-CM

## 2015-04-13 DIAGNOSIS — Z9889 Other specified postprocedural states: Secondary | ICD-10-CM

## 2015-04-13 NOTE — Therapy (Signed)
[place], [place], [address] Phone: [phone]   Fax:  [phone]  Physical Therapy Treatment  Patient Details  Name: Alexis Lopez MRN: [id number] Date of Birth: [birth year] Referring Provider: Dr. Hal Neer  Encounter Date: 04/13/2015      PT End of Session - 04/13/15 1042    Visit Number 8   Number of Visits 16   Date for PT Re-Evaluation 05/12/15   PT Start Time 0932   PT Stop Time 1030   PT Time Calculation (min) 58 min   Activity Tolerance Patient tolerated treatment well;No increased pain   Behavior During Therapy [facility] for tasks assessed/performed      Past Medical History  Diagnosis Date  . BPH (benign prostatic hyperplasia)   . Bladder cancer ([place])   . Diabetes mellitus without complication ([place])   . Hypertension   . Chronic kidney disease   . Neuromuscular disorder ([place])     stenosis -back  . Arthritis     legs  . AAA (abdominal aortic aneurysm) ([facility])     3.6 cm by 10/2014 MRI    Past Surgical History  Procedure Laterality Date  . Eye surgery      Cataract  . Appendectomy    . Tonsillectomy    . Thoracic discectomy Bilateral 02/08/2015    Procedure: Laminectomy - T11-T12 - bilateral;  Surgeon: Karie Chimera, MD;  Location: [place] [facility];  Service: Neurosurgery;  Laterality: Bilateral;  Laminectomy - T11-T12 - bilateral    There were no vitals filed for this visit.  Visit Diagnosis:  Muscle weakness (generalized)  Difficulty in walking  Spinal stenosis of lumbar region  Joint stiffness  Hx of decompressive lumbar laminectomy      Subjective Assessment - 04/13/15 1055    Subjective Knee has been sore since last visit.  Tape really did not help   Pain Score 0-No pain  RT knee up to 7/10 with walking, better with rest, achey, anterior                         [facility] Adult PT Treatment/Exercise - 04/13/15 0944    Ambulation/Gait   Gait Comments pre  gait,  weight shifting.  CGA,,  side to side, and diagonal ,  min assist.  Gait with walker encouraging longer steps.     High Level Balance   High Level Balance Comments 10 seconds with eyes closed close SBA.    Knee/Hip Exercises: Stretches   Active Hamstring Stretch 3 reps;30 seconds   Gastroc Stretch 3 reps;10 seconds  manually   Knee/Hip Exercises: Aerobic   Nustep L5 3 minutes   Knee/Hip Exercises: Standing   Forward Step Up Right;Hand Hold: 2;Step Height: 6"   Forward Step Up Limitations CGA   Knee/Hip Exercises: Seated   Hamstring Curl 15 reps  red band   Sit to Sand 5 reps;2 sets   Knee/Hip Exercises: Supine   Patellar Mobs able to loosen manually   Moist Heat Therapy   Number Minutes Moist Heat 20 Minutes  with exercise to prepare knee for hamstring stretch.     Moist Heat Location Knee                  PT Short Term Goals - 04/13/15 1048    PT SHORT TERM GOAL #1   Title The patient will be able to ambulate with RW .59m/sec with 10 meter walk indicating safer gait  speed needed for community ambulation   04/14/15   Baseline .71m/sec   Time 4   Period Weeks   Status Unable to assess   PT SHORT TERM GOAL #2   Title The patient will have improved HS length to 50 degrees and hip flexor length to 5 degrees for longer stride length with ambulation  04/14/15   Baseline continue to be limited   Time 4   Period Weeks   Status On-going   PT SHORT TERM GOAL #3   Title The patient will have improved BERG Balance score to 38/56 indicating decreased risk of falls    Time 4   Period Weeks   Status On-going           PT Long Term Goals - 04/13/15 1053    PT LONG TERM GOAL #1   Title The patient will be independent in HEP for further improvements in strength, ROM and balance   05/12/15   Time 8   Period Weeks   Status On-going   PT LONG TERM GOAL #2   Title The patient will have improved core and LE strength to at least 4/5 needed for household and community  ambulation and mobility   Time 8   Period Weeks   Status Unable to assess   PT LONG TERM GOAL #3   Title BERG balance score improved to 42/56 indicating decreased risk of falls   Time 8   Period Weeks   Status On-going   PT LONG TERM GOAL #4   Title Patient will be able to go up and down 2 steps with assistive device  and min assist needed to enter exit home safely   Baseline step ups in clinic, min assist    Time 8   Period Weeks   Status On-going   PT LONG TERM GOAL #5   Title Six minute walk test of 585 feet with appropriate assistive device to be in the 10th percentile of other 32+  year olds for safe community ambulation to church, stores and doctor's appointments   Time 8   Period Weeks   Status Unable to assess               Plan - 04/13/15 1046    Clinical Impression Statement Knee pain flared since last visit.  Step ups require less effort.       PT Next Visit Plan Continue with strengthening., standing balance, walking with SPC with CG, Measure hamstring.  Walk test?  Try stairs in PEDS gym.   PT Home Exercise Plan continue.  When you walk longer distances try longer step length.     Consulted and Agree with Plan of Care Patient;Family member/caregiver   Family Member Consulted spouse        Problem List Patient Active Problem List   Diagnosis Date Noted  . Lumbar stenosis with neurogenic claudication 02/08/2015  . Right knee pain 11/02/2014  . Weakness 10/26/2014  . Sinus bradycardia 10/26/2014  . Diabetes mellitus type 2, controlled ([place]) 10/26/2014  . Chronic anemia 10/26/2014  . Thrombocytopenia ([place]) 10/26/2014  . Low back pain 10/26/2014  . Chronic kidney disease, stage III (moderate) 03/13/2013  . Essential hypertension 03/04/2013  . Other and unspecified hyperlipidemia 03/04/2013  . Diabetes mellitus, stable ([place]) 11/21/2012    Alexis Lopez 04/13/2015, 11:01 AM  [facility] [address] [place], [place], [address] Phone: [phone]   Fax:  [phone]  Name: Alexis Lopez [facility] MRN:  [id number] Date of Birth: 12-Jun-1920    Melvenia Needles, PTA 04/13/2015 11:01 AM Phone: [phone] Fax: [phone]

## 2015-04-19 ENCOUNTER — Ambulatory Visit: Payer: 59 | Admitting: Physical Therapy

## 2015-04-19 DIAGNOSIS — M6281 Muscle weakness (generalized): Secondary | ICD-10-CM

## 2015-04-19 DIAGNOSIS — M48061 Spinal stenosis, lumbar region without neurogenic claudication: Secondary | ICD-10-CM

## 2015-04-19 DIAGNOSIS — Z9889 Other specified postprocedural states: Secondary | ICD-10-CM

## 2015-04-19 DIAGNOSIS — R262 Difficulty in walking, not elsewhere classified: Secondary | ICD-10-CM

## 2015-04-19 DIAGNOSIS — M2560 Stiffness of unspecified joint, not elsewhere classified: Secondary | ICD-10-CM

## 2015-04-19 DIAGNOSIS — M4806 Spinal stenosis, lumbar region: Secondary | ICD-10-CM | POA: Diagnosis not present

## 2015-04-19 NOTE — Therapy (Signed)
[place], [place], [address] Phone: [phone]   Fax:  [phone]  Physical Therapy Treatment  Patient Details  Name: Alexis Lopez MRN: [id number] Date of Birth: [birth year] Referring Provider: Dr. Hal Neer  Encounter Date: 04/19/2015      PT End of Session - 04/19/15 1138    Visit Number 9   Number of Visits 16   Date for PT Re-Evaluation 05/12/15   PT Start Time 0930   PT Stop Time 1020   PT Time Calculation (min) 50 min   Activity Tolerance Patient tolerated treatment well   Behavior During Therapy [facility] for tasks assessed/performed      Past Medical History  Diagnosis Date  . BPH (benign prostatic hyperplasia)   . Bladder cancer ([place])   . Diabetes mellitus without complication ([place])   . Hypertension   . Chronic kidney disease   . Neuromuscular disorder ([place])     stenosis -back  . Arthritis     legs  . AAA (abdominal aortic aneurysm) (H[facility])     3.6 cm by 10/2014 MRI    Past Surgical History  Procedure Laterality Date  . Eye surgery      Cataract  . Appendectomy    . Tonsillectomy    . Thoracic discectomy Bilateral 02/08/2015    Procedure: Laminectomy - T11-T12 - bilateral;  Surgeon: Karie Chimera, MD;  Location: [place] [facility];  Service: Neurosurgery;  Laterality: Bilateral;  Laminectomy - T11-T12 - bilateral    There were no vitals filed for this visit.  Visit Diagnosis:  Muscle weakness (generalized)  Difficulty in walking  Spinal stenosis of lumbar region  Joint stiffness  Hx of decompressive lumbar laminectomy      Subjective Assessment - 04/19/15 0941    Subjective Knee doing OK,  Back is doing OK   Currently in Pain? No/denies   Pain Score 7    Pain Location Back   Pain Frequency --  sharp.  better with change of position.   Aggravating Factors  certain ways he turns            [facility] PT Assessment - 04/19/15 0947    Ambulation/Gait   Gait velocity 50m  walk test, 16 seconds,  patient using walker.                     [facility] Adult PT Treatment/Exercise - 04/19/15 0947    Ambulation/Gait   Gait Pattern --  improves with longer steps,  trial of soft brace he brought   Stairs Yes   Stairs Assistance Other (comment)  contact guard assist, 2 rails.     Number of Stairs 4   Height of Stairs 6   Knee/Hip Exercises: Stretches   Passive Hamstring Stretch 3 reps;30 seconds   Gastroc Stretch 3 reps;30 seconds  toes on 2" step, SBA   Other Knee/Hip Stretches lower trunk rotation small movement 2 reps 5 seconds, legs on black ball, stopped due to pain.     Knee/Hip Exercises: Seated   Long Arc Quad AROM;Strengthening;Both;1 set  10 second holds   Long Arc [employer] --  0 LBS   Hamstring Curl 3 sets;10 reps   Hamstring Limitations green band   Sit to [employer] 10 reps   Knee/Hip Exercises: Supine   Bridges 10 reps  legs on black ball, small lifts   Straight Leg Raises --  2 X lifting from ball  PT Education - 04/19/15 0953    Education provided Yes   Education Details hamstring stretch   Person(s) Educated Patient;Spouse   Methods Explanation;Tactile cues;Verbal cues;Handout   Comprehension Verbalized understanding;Returned demonstration          PT Short Term Goals - 04/19/15 1143    PT SHORT TERM GOAL #1   Title The patient will be able to ambulate with RW .15m/sec with 10 meter walk indicating safer gait speed needed for community ambulation   04/14/15   Baseline 10 m walk test 16 seconds with rolling walker   Time 4   Period Weeks   Status On-going   PT SHORT TERM GOAL #2   Title The patient will have improved HS length to 50 degrees and hip flexor length to 5 degrees for longer stride length with ambulation  04/14/15   Time 4   Period Weeks   Status Unable to assess   PT SHORT TERM GOAL #3   Title The patient will have improved BERG Balance score to 38/56 indicating decreased risk of falls     Time 4   Period Weeks   Status Unable to assess           PT Long Term Goals - 04/13/15 1053    PT LONG TERM GOAL #1   Title The patient will be independent in HEP for further improvements in strength, ROM and balance   05/12/15   Time 8   Period Weeks   Status On-going   PT LONG TERM GOAL #2   Title The patient will have improved core and LE strength to at least 4/5 needed for household and community ambulation and mobility   Time 8   Period Weeks   Status Unable to assess   PT LONG TERM GOAL #3   Title BERG balance score improved to 42/56 indicating decreased risk of falls   Time 8   Period Weeks   Status On-going   PT LONG TERM GOAL #4   Title Patient will be able to go up and down 2 steps with assistive device  and min assist needed to enter exit home safely   Baseline step ups in clinic, min assist    Time 8   Period Weeks   Status On-going   PT LONG TERM GOAL #5   Title Six minute walk test of 585 feet with appropriate assistive device to be in the 10th percentile of other 46+  year olds for safe community ambulation to church, stores and doctor's appointments   Time 8   Period Weeks   Status Unable to assess               Plan - 04/19/15 1139    Clinical Impression Statement Patient says he is doing OK,  Spouse says she wants him to walk straighter.  Progress toward goal for stairs and able to progress home exercise goal.     PT Next Visit Plan Re- check,  10 visit note,  [place] Leader.  review hamstring stretch,  SPC    PT Home Exercise Plan hamstring stretch   Consulted and Agree with Plan of Care Patient   Family Member Consulted spouse        Problem List Patient Active Problem List   Diagnosis Date Noted  . Lumbar stenosis with neurogenic claudication 02/08/2015  . Right knee pain 11/02/2014  . Weakness 10/26/2014  . Sinus bradycardia 10/26/2014  . Diabetes mellitus type 2, controlled ([place]) 10/26/2014  .  Chronic anemia 10/26/2014  .  Thrombocytopenia ([place]) 10/26/2014  . Low back pain 10/26/2014  . Chronic kidney disease, stage III (moderate) 03/13/2013  . Essential hypertension 03/04/2013  . Other and unspecified hyperlipidemia 03/04/2013  . Diabetes mellitus, stable ([place]) 11/21/2012    Jhovani Griswold 04/19/2015, 11:47 AM  [facility] [address] [place], [place], [address] Phone: [phone]   Fax:  [phone]  Name: ROGIE MILUM MRN: [id number] Date of Birth: 04/06/1920    Melvenia Needles, PTA 04/19/2015 11:47 AM Phone: [phone] Fax: [phone]

## 2015-04-19 NOTE — Patient Instructions (Addendum)
Hamstring Step 1    Straighten left knee. Keep knee level with other knee or on bolster. Hold 30___ seconds. Relax knee by returning foot to start. Repeat _3_ times.  [employer]  [employer]. All rights reserved.

## 2015-04-22 ENCOUNTER — Ambulatory Visit: Payer: 59 | Attending: Neurosurgery

## 2015-04-22 DIAGNOSIS — M6281 Muscle weakness (generalized): Secondary | ICD-10-CM | POA: Diagnosis present

## 2015-04-22 DIAGNOSIS — R262 Difficulty in walking, not elsewhere classified: Secondary | ICD-10-CM | POA: Diagnosis present

## 2015-04-22 DIAGNOSIS — Z9889 Other specified postprocedural states: Secondary | ICD-10-CM | POA: Insufficient documentation

## 2015-04-22 DIAGNOSIS — M48061 Spinal stenosis, lumbar region without neurogenic claudication: Secondary | ICD-10-CM

## 2015-04-22 DIAGNOSIS — M2560 Stiffness of unspecified joint, not elsewhere classified: Secondary | ICD-10-CM

## 2015-04-22 DIAGNOSIS — M4806 Spinal stenosis, lumbar region: Secondary | ICD-10-CM | POA: Diagnosis present

## 2015-04-22 NOTE — Therapy (Signed)
[place], [place], [address] Phone: [phone]   Fax:  [phone]  Physical Therapy Treatment  Patient Details  Name: Alexis Lopez MRN: [id number] Date of Birth: 1920-05-05 Referring Provider: Dr. Hal Neer  Encounter Date: 04/22/2015      PT End of Session - 04/22/15 1011    Visit Number 10   Number of Visits 16   Date for PT Re-Evaluation 05/12/15   PT Start Time 0930   PT Stop Time 1015   PT Time Calculation (min) 45 min   Activity Tolerance Patient tolerated treatment well   Behavior During Therapy [facility] for tasks assessed/performed      Past Medical History  Diagnosis Date  . BPH (benign prostatic hyperplasia)   . Bladder cancer ([place])   . Diabetes mellitus without complication ([place])   . Hypertension   . Chronic kidney disease   . Neuromuscular disorder ([place])     stenosis -back  . Arthritis     legs  . AAA (abdominal aortic aneurysm) ([facility])     3.6 cm by 10/2014 MRI    Past Surgical History  Procedure Laterality Date  . Eye surgery      Cataract  . Appendectomy    . Tonsillectomy    . Thoracic discectomy Bilateral 02/08/2015    Procedure: Laminectomy - T11-T12 - bilateral;  Surgeon: Karie Chimera, MD;  Location: [place] [facility];  Service: Neurosurgery;  Laterality: Bilateral;  Laminectomy - T11-T12 - bilateral    There were no vitals filed for this visit.  Visit Diagnosis:  Muscle weakness (generalized)  Difficulty in walking  Joint stiffness  Spinal stenosis of lumbar region      Subjective Assessment - 04/22/15 1518    Subjective No pain today.    Currently in Pain? No/denies            [facility] PT Assessment - 04/22/15 0001    Strength   Right Hip Flexion 4-/5   Right Hip Extension 3+/5   Right Hip External Rotation  4/5   Right Hip Internal Rotation 4/5   Right Hip ABduction 3+/5   Right Hip ADduction 4/5   Left Hip Flexion 4-/5   Left Hip Extension 3+/5   Left  Hip External Rotation 4/5   Left Hip Internal Rotation 4/5   Left Hip ABduction 3+/5   Right Knee Flexion 5/5   Right Knee Extension 5/5   Left Knee Flexion 5/5   Left Knee Extension 5/5   Right Ankle Dorsiflexion 4/5   Right Ankle Plantar Flexion 4-/5   Left Ankle Dorsiflexion 4/5   Left Ankle Plantar Flexion 4-/5   Berg Balance Test   Sit to Stand Able to stand  independently using hands   Standing Unsupported Able to stand 2 minutes with supervision   Sitting with Back Unsupported but Feet Supported on Floor or Stool Able to sit safely and securely 2 minutes   Stand to Sit Controls descent by using hands   Transfers Able to transfer safely, definite need of hands   Standing Unsupported with Eyes Closed Able to stand 10 seconds with supervision   Standing Ubsupported with Feet Together Needs help to attain position but able to stand for 30 seconds with feet together   From Standing, Reach Forward with Outstretched Arm Can reach forward >5 cm safely (2")   From Standing Position, Pick up Object from Floor Able to pick up shoe, needs supervision   From Standing Position,  Turn to Look Behind Over each Shoulder Turn sideways only but maintains balance   Turn 360 Degrees Needs close supervision or verbal cueing   Standing Unsupported, Alternately Place Feet on Step/Stool Able to complete >2 steps/needs minimal assist   Standing Unsupported, One Foot in Front Able to take small step independently and hold 30 seconds   Standing on One Leg Tries to lift leg/unable to hold 3 seconds but remains standing independently   Total Score 32   Berg comment: BERG score indicates he still needs walker for safety                     [facility] Adult PT Treatment/Exercise - 04/22/15 0001    Knee/Hip Exercises: Standing   Heel Raises Both;15 reps   Knee/Hip Exercises: Seated   Long Arc Quad AROM;Strengthening;Both;1 set;15 reps   Sit to [employer] --  12 reps light CG with hands for safety.                 PT Education - 04/22/15 1522    Education provided Yes   Education Details Results of BERG balance testing and MMT   Person(s) Educated Patient;Caregiver(s)   Methods Explanation   Comprehension Verbalized understanding          PT Short Term Goals - 04/22/15 1529    PT SHORT TERM GOAL #3   Title The patient will have improved BERG Balance score to 38/56 indicating decreased risk of falls    Status On-going           PT Long Term Goals - 04/22/15 1529    PT LONG TERM GOAL #1   Title The patient will be independent in HEP for further improvements in strength, ROM and balance   05/12/15   Status On-going   PT LONG TERM GOAL #2   Title The patient will have improved core and LE strength to at least 4/5 needed for household and community ambulation and mobility   Status Partially Met   PT LONG TERM GOAL #3   Status On-going   PT LONG TERM GOAL #4   Title Patient will be able to go up and down 2 steps with assistive device  and min assist needed to enter exit home safely   Status On-going   PT LONG TERM GOAL #5   Title Six minute walk test of 585 feet with appropriate assistive device to be in the 10th percentile of other 54+  year olds for safe community ambulation to church, stores and doctor's appointments   Status On-going               Plan - 04/22/15 1523    Clinical Impression Statement Mr Butler ia the same with BERG test results. His muscle strength is some improved . He has  better balance than at eval as family reports he is able to stand without contact guard to wah hands or do other tasks not holding to walker. REsults of testing explained to pt and caregive and to pt.daughter as I called to speak to her about Mr Houstons's progress. She reported he wil be moving to [place] in 3 weeks to live near family. I suggested MD order  F/U PT when he is settled. He reports rideing stat bike for 20-25 min at home . I reminded then that balance score  not improved and he needed to  continue to use walker at all times.  We will  see what we can  do to advance HEP and give some balance activity as long as he is CG or very close stand by assisst    PT Next Visit Plan .  review hamstring stretch,  Review HEP and progress as able, review and attempt LTG to see if acheived   Consulted and Agree with Plan of Care Patient   Family Member Consulted daughter over phone          G-Codes - 04-29-2015 0941    Functional Assessment Tool Used BERG, clinical judgement   Mobility: Walking and Moving Around Current Status (O1561) At least 60 percent but less than 80 percent impaired, limited or restricted   Mobility: Walking and Moving Around Goal Status [phone]) At least 40 percent but less than 60 percent impaired, limited or restricted      Problem List Patient Active Problem List   Diagnosis Date Noted  . Lumbar stenosis with neurogenic claudication 02/08/2015  . Right knee pain 11/02/2014  . Weakness 10/26/2014  . Sinus bradycardia 10/26/2014  . Diabetes mellitus type 2, controlled ([place]) 10/26/2014  . Chronic anemia 10/26/2014  . Thrombocytopenia ([place]) 10/26/2014  . Low back pain 10/26/2014  . Chronic kidney disease, stage III (moderate) 03/13/2013  . Essential hypertension 03/04/2013  . Other and unspecified hyperlipidemia 03/04/2013  . Diabetes mellitus, stable ([place]) 11/21/2012    Darrel Hoover PT 2015-04-29, 3:36 PM  [facility] [address] [place], [place], [address] Phone: [phone]   Fax:  [phone]  Name: ZYGMUNT MCGLINN MRN: [id number] Date of Birth: [birth year]

## 2015-04-22 NOTE — Patient Instructions (Signed)
Caregiver and pt were asked to bring in all HEP issued on paper so they can be reviewed

## 2015-04-25 ENCOUNTER — Ambulatory Visit: Payer: 59

## 2015-04-25 DIAGNOSIS — M2560 Stiffness of unspecified joint, not elsewhere classified: Secondary | ICD-10-CM

## 2015-04-25 DIAGNOSIS — M6281 Muscle weakness (generalized): Secondary | ICD-10-CM

## 2015-04-25 DIAGNOSIS — M48061 Spinal stenosis, lumbar region without neurogenic claudication: Secondary | ICD-10-CM

## 2015-04-25 DIAGNOSIS — R262 Difficulty in walking, not elsewhere classified: Secondary | ICD-10-CM

## 2015-04-25 NOTE — Patient Instructions (Signed)
From cabinet issued standing with walker  Wall sits , sit to stand, hip abduction and extension with marching 1-2x/day 10-20 reps, and standing balance with CG to min assist

## 2015-04-25 NOTE — Therapy (Signed)
[place], [place], [address] Phone: [phone]   Fax:  [phone]  Physical Therapy Treatment  Patient Details  Name: Alexis Lopez MRN: [id number] Date of Birth: 1920/05/05 Referring Provider: Dr. Hal Neer  Encounter Date: 04/25/2015      PT End of Session - 04/25/15 1106    Visit Number 11   Number of Visits 16   Date for PT Re-Evaluation 05/12/15   PT Start Time 0930   PT Stop Time 1015   PT Time Calculation (min) 45 min   Activity Tolerance Patient tolerated treatment well   Behavior During Therapy [facility] for tasks assessed/performed      Past Medical History  Diagnosis Date  . BPH (benign prostatic hyperplasia)   . Bladder cancer ([place])   . Diabetes mellitus without complication ([place])   . Hypertension   . Chronic kidney disease   . Neuromuscular disorder ([place])     stenosis -back  . Arthritis     legs  . AAA (abdominal aortic aneurysm) (HCC)     3.6 cm by 10/2014 MRI    Past Surgical History  Procedure Laterality Date  . Eye surgery      Cataract  . Appendectomy    . Tonsillectomy    . Thoracic discectomy Bilateral 02/08/2015    Procedure: Laminectomy - T11-T12 - bilateral;  Surgeon: Karie Chimera, MD;  Location: [facility];  Service: Neurosurgery;  Laterality: Bilateral;  Laminectomy - T11-T12 - bilateral    There were no vitals filed for this visit.  Visit Diagnosis:  Muscle weakness (generalized)  Difficulty in walking  Joint stiffness  Spinal stenosis of lumbar region      Subjective Assessment - 04/25/15 0935    Subjective No pain today. Brought exercise today.    Patient is accompained by: Family member  caregiver   Currently in Pain? No/denies                         [facility] Adult PT Treatment/Exercise - 04/25/15 0937    Knee/Hip Exercises: Aerobic   Nustep L5 6 minutes   Knee/Hip Exercises: Standing   Heel Raises Both;15 reps   Other Standing  Knee Exercises At walker marching, hip abduction and extension, heel raises, sit to stand, wall sits with closse supervision and standing ankle range, blaance single leg and sit to stand all x 10-15 reps with instruction to daughter and caregiver.                 PT Education - 04/25/15 1105    Education provided Yes   Education Details HEP   Person(s) Educated Patient;Child(ren);Caregiver(s)   Methods Explanation;Demonstration;Verbal cues;Handout   Comprehension Returned demonstration;Verbalized understanding          PT Short Term Goals - 04/22/15 1529    PT SHORT TERM GOAL #3   Title The patient will have improved BERG Balance score to 38/56 indicating decreased risk of falls    Status On-going           PT Long Term Goals - 04/22/15 1529    PT LONG TERM GOAL #1   Title The patient will be independent in HEP for further improvements in strength, ROM and balance   05/12/15   Status On-going   PT LONG TERM GOAL #2   Title The patient will have improved core and LE strength to at least 4/5 needed for household and community ambulation and  mobility   Status Partially Met   PT LONG TERM GOAL #3   Status On-going   PT LONG TERM GOAL #4   Title Patient will be able to go up and down 2 steps with assistive device  and min assist needed to enter exit home safely   Status On-going   PT LONG TERM GOAL #5   Title Six minute walk test of 585 feet with appropriate assistive device to be in the 10th percentile of other 13+  year olds for safe community ambulation to church, stores and doctor's appointments   Status On-going               Plan - 04/25/15 1106    Clinical Impression Statement Will review HEP and add as appropriate next visit. Daughter who is to live near pt when he moves was here today and was instructed in HEP.    PT Next Visit Plan   Review HEP and progress as able, review and attempt LTG to see if acheived   Consulted and Agree with Plan of Care  Patient;Family member/caregiver        Problem List Patient Active Problem List   Diagnosis Date Noted  . Lumbar stenosis with neurogenic claudication 02/08/2015  . Right knee pain 11/02/2014  . Weakness 10/26/2014  . Sinus bradycardia 10/26/2014  . Diabetes mellitus type 2, controlled ([place]) 10/26/2014  . Chronic anemia 10/26/2014  . Thrombocytopenia ([place]) 10/26/2014  . Low back pain 10/26/2014  . Chronic kidney disease, stage III (moderate) 03/13/2013  . Essential hypertension 03/04/2013  . Other and unspecified hyperlipidemia 03/04/2013  . Diabetes mellitus, stable ([place]) 11/21/2012    Darrel Hoover PT 04/25/2015, 11:08 AM  [facility] [address] [place], [place], [address] Phone: [phone]   Fax:  [phone]  Name: Alexis Lopez MRN: [id number] Date of Birth: [birth year]

## 2015-04-26 ENCOUNTER — Ambulatory Visit: Payer: Medicare Other | Admitting: Podiatry

## 2015-04-27 ENCOUNTER — Ambulatory Visit: Payer: 59

## 2015-04-28 ENCOUNTER — Ambulatory Visit: Payer: 59 | Admitting: Physical Therapy

## 2015-04-28 DIAGNOSIS — Z9889 Other specified postprocedural states: Secondary | ICD-10-CM

## 2015-04-28 DIAGNOSIS — M2560 Stiffness of unspecified joint, not elsewhere classified: Secondary | ICD-10-CM

## 2015-04-28 DIAGNOSIS — M6281 Muscle weakness (generalized): Secondary | ICD-10-CM | POA: Diagnosis not present

## 2015-04-28 DIAGNOSIS — M48061 Spinal stenosis, lumbar region without neurogenic claudication: Secondary | ICD-10-CM

## 2015-04-28 DIAGNOSIS — R262 Difficulty in walking, not elsewhere classified: Secondary | ICD-10-CM

## 2015-04-28 NOTE — Therapy (Signed)
[place], [place], [address] Phone: [phone]   Fax:  [phone]  Physical Therapy Treatment  Patient Details  Name: Alexis Lopez MRN: [id number] Date of Birth: Nov 10, 1920 Referring Provider: Dr. Hal Neer  Encounter Date: 04/28/2015      PT End of Session - 04/28/15 1117    Visit Number 12   Number of Visits 16   Date for PT Re-Evaluation 05/12/15   PT Start Time 6415   PT Stop Time 1112   PT Time Calculation (min) 57 min   Activity Tolerance Patient tolerated treatment well   Behavior During Therapy [facility] for tasks assessed/performed      Past Medical History  Diagnosis Date  . BPH (benign prostatic hyperplasia)   . Bladder cancer ([place])   . Diabetes mellitus without complication ([place])   . Hypertension   . Chronic kidney disease   . Neuromuscular disorder ([place])     stenosis -back  . Arthritis     legs  . AAA (abdominal aortic aneurysm) (HCC)     3.6 cm by 10/2014 MRI    Past Surgical History  Procedure Laterality Date  . Eye surgery      Cataract  . Appendectomy    . Tonsillectomy    . Thoracic discectomy Bilateral 02/08/2015    Procedure: Laminectomy - T11-T12 - bilateral;  Surgeon: Karie Chimera, MD;  Location: [place] [facility];  Service: Neurosurgery;  Laterality: Bilateral;  Laminectomy - T11-T12 - bilateral    There were no vitals filed for this visit.  Visit Diagnosis:  Muscle weakness (generalized)  Difficulty in walking  Joint stiffness  Spinal stenosis of lumbar region  Hx of decompressive lumbar laminectomy      Subjective Assessment - 04/28/15 1020    Subjective No pain except RT knee 7/10, worse with walking, better with sitting.  Saw a hemi walker in lobby, wants to try one.  Able to go up and down steps at home using pole for pourch with no problem.   Currently in Pain? Yes                         [facility] Adult PT Treatment/Exercise - 04/28/15  0001    Ambulation/Gait   Gait Comments pre gait large steps   High Level Balance   High Level Balance Comments side steps at counter   Knee/Hip Exercises: Stretches   Passive Hamstring Stretch 3 reps;30 seconds  added to home exercises   Knee/Hip Exercises: Standing   Hip Flexion Limitations yellow band 10 X each holding walker   Hip Extension 10 reps;1 set;Both  cga   Forward Step Up Both;1 set;5 reps;Hand Hold: 2;Step Height: 6"   Knee/Hip Exercises: Seated   Hamstring Curl 3 sets;10 reps  yellow band, both   Sit to Sand 10 reps  knee height mat   Cryotherapy   Number Minutes Cryotherapy 10 Minutes   Cryotherapy Location Knee   Type of Cryotherapy --  cold pack   Ankle Exercises: Standing   Heel Raises 10 reps   Toe Raise 10 reps                PT Education - 04/28/15 1117    Education provided Yes   Education Details hamstring stretch   Person(s) Educated Patient;Caregiver(s)   Methods Explanation;Demonstration;Tactile cues;Verbal cues;Handout   Comprehension Verbalized understanding;Returned demonstration;Need further instruction          PT Short  Term Goals - 04/28/15 1120    PT SHORT TERM GOAL #1   Title The patient will be able to ambulate with RW .69msec with 10 meter walk indicating safer gait speed needed for community ambulation   04/14/15   Time 4   Period Weeks   Status Unable to assess   PT SHORT TERM GOAL #2   Title The patient will have improved HS length to 50 degrees and hip flexor length to 5 degrees for longer stride length with ambulation  04/14/15   Baseline added stretch to home exercise   Time 4   Period Weeks   Status On-going   PT SHORT TERM GOAL #3   Title The patient will have improved BERG Balance score to 38/56 indicating decreased risk of falls    Time 4   Period Weeks   Status Unable to assess           PT Long Term Goals - 04/28/15 1121    PT LONG TERM GOAL #1   Title The patient will be independent in HEP for  further improvements in strength, ROM and balance   05/12/15   Baseline needs cues   Time 8   Period Weeks   Status On-going   PT LONG TERM GOAL #2   Title The patient will have improved core and LE strength to at least 4/5 needed for household and community ambulation and mobility   Time 8   Period Weeks   Status Partially Met   PT LONG TERM GOAL #3   Title BERG balance score improved to 42/56 indicating decreased risk of falls   Time 8   Period Weeks   Status Unable to assess   PT LONG TERM GOAL #4   Title Patient will be able to go up and down 2 steps with assistive device  and min assist needed to enter exit home safely   Baseline able to do easily per caregiver and patient,   Time 8   Period Weeks   Status Achieved   PT LONG TERM GOAL #5   Title Six minute walk test of 585 feet with appropriate assistive device to be in the 10th percentile of other 952  year olds for safe community ambulation to church, stores and doctor's appointments   Time 8   Period Weeks   Status Unable to assess               Plan - 04/28/15 1119    Clinical Impression Statement Patient able to achieve LTG for steps.  Hamstring stretch added to home program to assist with RT knee pain.  (Wife sent message about knee)        Problem List Patient Active Problem List   Diagnosis Date Noted  . Lumbar stenosis with neurogenic claudication 02/08/2015  . Right knee pain 11/02/2014  . Weakness 10/26/2014  . Sinus bradycardia 10/26/2014  . Diabetes mellitus type 2, controlled ([place] 10/26/2014  . Chronic anemia 10/26/2014  . Thrombocytopenia ([place] 10/26/2014  . Low back pain 10/26/2014  . Chronic kidney disease, stage III (moderate) 03/13/2013  . Essential hypertension 03/04/2013  . Other and unspecified hyperlipidemia 03/04/2013  . Diabetes mellitus, stable ([place] 11/21/2012    Kacyn Souder 04/28/2015, 11:23 AM  [facility] [address]Phone: [phone]  Fax:  [phone] Name: Alexis SIMMERINGMRN: [id number] of Birth: [birth year]   KMelvenia Needles PTA 04/28/2015 11:23 AM  Phone: [phone] Fax: [phone]

## 2015-04-28 NOTE — Patient Instructions (Signed)
Hamstring: Stretch (Sitting)    Sit straight. Extend right knee until stretch is felt in back of thigh. Hold 30___ seconds. Relax. Repeat __3_ times. Do 1___ times a day. Repeat with other leg. Advanced: Lean trunk forward.   DO this with foot resting on stool.  Copyright  VHI. All rights reserved.

## 2015-04-29 ENCOUNTER — Encounter: Payer: Self-pay | Admitting: Podiatry

## 2015-04-29 ENCOUNTER — Ambulatory Visit (INDEPENDENT_AMBULATORY_CARE_PROVIDER_SITE_OTHER): Payer: Medicare Other | Admitting: Podiatry

## 2015-04-29 DIAGNOSIS — B351 Tinea unguium: Secondary | ICD-10-CM | POA: Diagnosis not present

## 2015-04-29 DIAGNOSIS — M79676 Pain in unspecified toe(s): Secondary | ICD-10-CM | POA: Diagnosis not present

## 2015-04-29 NOTE — Progress Notes (Signed)
Patient ID: Alexis Lopez, male   DOB: May 23, 1920, 82 y.o.   MRN: [id number] Complaint:  Visit Type: Patient returns to my office for continued preventative foot care services. Complaint: Patient states" my nails have grown long and thick and become painful to walk and wear shoes" Patient has been diagnosed with DM with neuropathy.. The patient presents for preventative foot care services. No changes to ROS  Podiatric Exam: Vascular: dorsalis pedis and posterior tibial pulses are palpable bilateral. Capillary return is immediate. Temperature gradient is WNL. Skin turgor WNL  Sensorium: Diminished  Semmes Weinstein monofilament test. Normal tactile sensation bilaterally. Nail Exam: Pt has thick disfigured discolored nails with subungual debris noted bilateral entire nail hallux through fifth toenails Ulcer Exam: There is no evidence of ulcer or pre-ulcerative changes or infection. Orthopedic Exam: Muscle tone and strength are WNL. No limitations in general ROM. No crepitus or effusions noted. Foot type and digits show no abnormalities. Bony prominences are unremarkable. Skin: No Porokeratosis. No infection or ulcers  Diagnosis:  Onychomycosis, , Pain in right toe, pain in left toes  Treatment & Plan Procedures and Treatment: Consent by patient was obtained for treatment procedures. The patient understood the discussion of treatment and procedures well. All questions were answered thoroughly reviewed. Debridement of mycotic and hypertrophic toenails, 1 through 5 bilateral and clearing of subungual debris. No ulceration, no infection noted.  Return Visit-Office Procedure: Patient instructed to return to the office for a follow up visit 3 months for continued evaluation and treatment.   Gardiner Barefoot DPM

## 2015-05-02 ENCOUNTER — Ambulatory Visit: Payer: 59

## 2015-05-02 DIAGNOSIS — M48061 Spinal stenosis, lumbar region without neurogenic claudication: Secondary | ICD-10-CM

## 2015-05-02 DIAGNOSIS — R262 Difficulty in walking, not elsewhere classified: Secondary | ICD-10-CM

## 2015-05-02 DIAGNOSIS — Z9889 Other specified postprocedural states: Secondary | ICD-10-CM

## 2015-05-02 DIAGNOSIS — M2560 Stiffness of unspecified joint, not elsewhere classified: Secondary | ICD-10-CM

## 2015-05-02 DIAGNOSIS — M6281 Muscle weakness (generalized): Secondary | ICD-10-CM | POA: Diagnosis not present

## 2015-05-02 NOTE — Therapy (Addendum)
[place], [place], [address] Phone: [phone]   Fax:  [phone]  Physical Therapy Treatment  Patient Details  Name: Alexis Lopez MRN: [id number] Date of Birth: 07-Jan-1921 Referring Provider: Dr. Hal Lopez  Encounter Date: 05/02/2015      PT End of Session - 05/02/15 1016    Visit Number 13   Number of Visits 16   Date for PT Re-Evaluation 05/12/15   PT Start Time 0928   PT Stop Time 1012   PT Time Calculation (min) 44 min   Activity Tolerance Patient tolerated treatment well   Behavior During Therapy [facility] for tasks assessed/performed      Past Medical History  Diagnosis Date  . BPH (benign prostatic hyperplasia)   . Bladder cancer ([place])   . Diabetes mellitus without complication ([place])   . Hypertension   . Chronic kidney disease   . Neuromuscular disorder ([place])     stenosis -back  . Arthritis     legs  . AAA (abdominal aortic aneurysm) (HCC)     3.6 cm by 10/2014 MRI    Past Surgical History  Procedure Laterality Date  . Eye surgery      Cataract  . Appendectomy    . Tonsillectomy    . Thoracic discectomy Bilateral 02/08/2015    Procedure: Laminectomy - T11-T12 - bilateral;  Surgeon: Alexis Chimera, MD;  Location: [place] [facility];  Service: Neurosurgery;  Laterality: Bilateral;  Laminectomy - T11-T12 - bilateral    There were no vitals filed for this visit.  Visit Diagnosis:  Muscle weakness (generalized)  Difficulty in walking  Joint stiffness  Spinal stenosis of lumbar region  Hx of decompressive lumbar laminectomy      Subjective Assessment - 05/02/15 1022    Patient is accompained by: Family member  spouse   Currently in Pain? No/denies            [facility] PT Assessment - 05/02/15 0945    Ambulation/Gait   Gait Comments walked 650 feet  in 6 min with RW then worked with +2 handhold marching forward , walking back and sidesteps x2 RT and LT 20 feet/   6 Minute Walk-  Baseline   6 Minute Walk- Baseline no     PF x 15 with hand hold assist.  Nustep L# 6 min Adjusted walker so wheel on outside for more stability. Explained this to spouse and option to return to inside if he is hitting furniture with walker              [place] Adult PT Treatment/Exercise - 05/02/15 0945    Knee/Hip Exercises: Seated   Sit to Sand 10 reps  from mat                PT Education - 05/02/15 1015    Education provided Yes   Education Details Discussed posture with walking with walker and to not expect good posture but to not allow excessive flexion with feet behind walker We reviewed what was expected with the walker and Alexis Lopez) Educated Patient;Spouse   Methods Explanation;Demonstration   Comprehension Verbalized understanding;Returned demonstration          PT Short Term Goals - 04/28/15 1120    PT SHORT TERM GOAL #1   Title The patient will be able to ambulate with RW .45msec with 10 meter walk indicating safer gait speed needed for community ambulation   04/14/15   Time 4  Period Weeks   Status Unable to assess   PT SHORT TERM GOAL #2   Title The patient will have improved HS length to 50 degrees and hip flexor length to 5 degrees for longer stride length with ambulation  04/14/15   Baseline added stretch to home exercise   Time 4   Period Weeks   Status On-going   PT SHORT TERM GOAL #3   Title The patient will have improved BERG Balance score to 38/56 indicating decreased risk of falls    Time 4   Period Weeks   Status Unable to assess           PT Long Term Goals - 04/28/15 1121    PT LONG TERM GOAL #1   Title The patient will be independent in HEP for further improvements in strength, ROM and balance   05/12/15   Baseline needs cues   Time 8   Period Weeks   Status On-going   PT LONG TERM GOAL #2   Title The patient will have improved core and LE strength to at least 4/5 needed for household and community  ambulation and mobility   Time 8   Period Weeks   Status Partially Met   PT LONG TERM GOAL #3   Title BERG balance score improved to 42/56 indicating decreased risk of falls   Time 8   Period Weeks   Status Unable to assess   PT LONG TERM GOAL #4   Title Patient will be able to go up and down 2 steps with assistive device  and min assist needed to enter exit home safely   Baseline able to do easily per caregiver and patient,   Time 8   Period Weeks   Status Achieved   PT LONG TERM GOAL #5   Title Six minute walk test of 585 feet with appropriate assistive device to be in the 10th percentile of other 61+  year olds for safe community ambulation to church, stores and doctor's appointments   Time 8   Period Weeks   Status Unable to assess               Plan - 05/02/15 1017    Clinical Impression Statement No pain today and reviewed proper walking posture. Alexis Lopez reported RT knee in spots bone on bone . He has seen Ortho MD and a suggestion of some injection was made. I suggested when moving to [place] she revisit that possability  with Ortho MD there.  He should be able to do basis community access with walker   though apparently he does not go out except to come here and MD appointments.    PT Next Visit Plan Final treatment next session. Review HEP and discharge.  They are moving and we disscussed  PT referral with MD order in new location.    Consulted and Agree with Plan of Care Patient;Family member/caregiver   Family Member Consulted spouse        Problem List Patient Active Problem List   Diagnosis Date Noted  . Lumbar stenosis with neurogenic claudication 02/08/2015  . Right knee pain 11/02/2014  . Weakness 10/26/2014  . Sinus bradycardia 10/26/2014  . Diabetes mellitus type 2, controlled ([place]) 10/26/2014  . Chronic anemia 10/26/2014  . Thrombocytopenia ([place]) 10/26/2014  . Low back pain 10/26/2014  . Chronic kidney disease, stage III (moderate) 03/13/2013  .  Essential hypertension 03/04/2013  . Other and unspecified hyperlipidemia 03/04/2013  . Diabetes mellitus,  stable ([place]) 11/21/2012    Alexis Lopez PT 05/02/2015, 10:27 AM  [facility] [address] [place], [place], [address] Phone: [phone]   Fax:  [phone]  Name: Alexis Lopez MRN: [id number] Date of Birth: [birth year]

## 2015-05-04 ENCOUNTER — Ambulatory Visit: Payer: 59

## 2015-05-04 DIAGNOSIS — M6281 Muscle weakness (generalized): Secondary | ICD-10-CM

## 2015-05-04 DIAGNOSIS — M48061 Spinal stenosis, lumbar region without neurogenic claudication: Secondary | ICD-10-CM

## 2015-05-04 DIAGNOSIS — R262 Difficulty in walking, not elsewhere classified: Secondary | ICD-10-CM

## 2015-05-04 DIAGNOSIS — M2560 Stiffness of unspecified joint, not elsewhere classified: Secondary | ICD-10-CM

## 2015-05-04 NOTE — Therapy (Signed)
[place], [place], [address] Phone: [phone]   Fax:  [phone]  Physical Therapy Treatment  Patient Details  Name: Alexis Lopez MRN: [id number] Date of Birth: [birth year] Referring Provider: Dr. Hal Neer  Encounter Date: 05/04/2015      PT End of Session - 05/04/15 1022    Visit Number 14   Number of Visits 16   PT Start Time 0930   PT Stop Time 1015   PT Time Calculation (min) 45 min   Activity Tolerance Patient tolerated treatment well   Behavior During Therapy [facility] for tasks assessed/performed      Past Medical History  Diagnosis Date  . BPH (benign prostatic hyperplasia)   . Bladder cancer ([place])   . Diabetes mellitus without complication ([place])   . Hypertension   . Chronic kidney disease   . Neuromuscular disorder ([place])     stenosis -back  . Arthritis     legs  . AAA (abdominal aortic aneurysm) ([facility])     3.6 cm by 10/2014 MRI    Past Surgical History  Procedure Laterality Date  . Eye surgery      Cataract  . Appendectomy    . Tonsillectomy    . Thoracic discectomy Bilateral 02/08/2015    Procedure: Laminectomy - T11-T12 - bilateral;  Surgeon: Karie Chimera, MD;  Location: [place] [facility];  Service: Neurosurgery;  Laterality: Bilateral;  Laminectomy - T11-T12 - bilateral    There were no vitals filed for this visit.  Visit Diagnosis:  Muscle weakness (generalized)  Difficulty in walking  Joint stiffness  Spinal stenosis of lumbar region      Subjective Assessment - 05/04/15 0941    Subjective No pain   Currently in Pain? No/denies            [facility] PT Assessment - 05/04/15 0949    Strength   Right Hip Flexion 4/5   Right Hip Extension 4/5  in sitting due to back pain   Right Hip External Rotation  4+/5   Right Hip Internal Rotation 4+/5   Right Hip ABduction 4+/5  in sitting due to back pain with lying   Right Hip ADduction 5/5   Left Hip Flexion 4+/5   Left  Hip Extension 4/5  in sitting due to back pain with lying   Left Hip External Rotation 4+/5   Left Hip Internal Rotation 4+/5   Left Hip ABduction 4+/5  in sitting due to back pian lying   Left Hip ADduction 5/5   Right Knee Flexion 5/5   Right Knee Extension 5/5   Left Knee Flexion 5/5   Left Knee Extension 5/5   Right Ankle Dorsiflexion 5/5   Right Ankle Plantar Flexion 4/5   Left Ankle Dorsiflexion 5/5   Left Ankle Plantar Flexion 4/5   Flexibility   Soft Tissue Assessment /Muscle Length yes   Hamstrings 45 degrees RT and LT   Ambulation/Gait   Gait velocity .66 m/sec with RW   Berg Balance Test   Sit to Stand Needs minimal aid to stand or to stabilize   Standing Unsupported Able to stand 2 minutes with supervision   Sitting with Back Unsupported but Feet Supported on Floor or Stool Able to sit safely and securely 2 minutes   Stand to Sit Controls descent by using hands   Transfers Able to transfer with verbal cueing and /or supervision   Standing Unsupported with Eyes Closed Able to stand 10 seconds  with supervision   Standing Ubsupported with Feet Together Needs help to attain position but able to stand for 30 seconds with feet together   From Standing, Reach Forward with Outstretched Arm Can reach forward >5 cm safely (2")   From Standing Position, Pick up Object from Floor Able to pick up shoe, needs supervision   From Standing Position, Turn to Look Behind Over each Shoulder Turn sideways only but maintains balance   Turn 360 Degrees Needs close supervision or verbal cueing   Standing Unsupported, Alternately Place Feet on Step/Stool Able to complete >2 steps/needs minimal assist   Standing Unsupported, One Foot in Front Able to take small step independently and hold 30 seconds   Standing on One Leg Tries to lift leg/unable to hold 3 seconds but remains standing independently   Total Score 29                     [facility] Adult PT Treatment/Exercise - 05/04/15  0949    Knee/Hip Exercises: Seated   Sit to Sand 15 reps  Knee mildly sore after    Nustep 7 min L5 LE only. Monitored for pace              PT Short Term Goals - 05/04/15 1016    PT SHORT TERM GOAL #1   Title The patient will be able to ambulate with RW .84msec with 10 meter walk indicating safer gait speed needed for community ambulation   04/14/15   Status Achieved   PT SHORT TERM GOAL #2   Title The patient will have improved HS length to 50 degrees and hip flexor length to 5 degrees for longer stride length with ambulation  04/14/15   Status Not Met   PT SHORT TERM GOAL #3   Title The patient will have improved BERG Balance score to 38/56 indicating decreased risk of falls    Status Not Met           PT Long Term Goals - 05/04/15 1016    PT LONG TERM GOAL #1   Title The patient will be independent in HEP for further improvements in strength, ROM and balance   05/12/15   Baseline Family reports doing exercise with help   Status Partially Met   PT LONG TERM GOAL #2   Title The patient will have improved core and LE strength to at least 4/5 needed for household and community ambulation and mobility   Status Achieved   PT LONG TERM GOAL #3   Title BERG balance score improved to 42/56 indicating decreased risk of falls   Status Not Met   PT LONG TERM GOAL #4   Title Patient will be able to go up and down 2 steps with assistive device  and min assist needed to enter exit home safely   Status Achieved   PT LONG TERM GOAL #5   Title Six minute walk test of 585 feet with appropriate assistive device to be in the 10th percentile of other 964  year olds for safe community ambulation to church, stores and doctor's appointments   Status Achieved               Plan - 05/04/15 1022    Clinical Impression Statement Alexis Lopez improved form eval with increased strength and improved balance and mobility with RW /enducrance but his alance is poor and he is not safe off  walker. He may benefit from further PT working on  balance but he may have maxed progress . Follow up in new home may be worht it at least for short time frame.  This was discussed with pt. and spouse and they agreed.    PT Next Visit Plan Discharge today   Consulted and Agree with Plan of Care Patient   Family Member Consulted spouse          G-Codes - 05-23-2015 1005    Functional Assessment Tool Used BERG, clinical judgement   Functional Limitation Mobility: Walking and moving around   Mobility: Walking and Moving Around Goal Status [phone]) At least 40 percent but less than 60 percent impaired, limited or restricted   Mobility: Walking and Moving Around Discharge Status [phone]) At least 60 percent but less than 80 percent impaired, limited or restricted      Problem List Patient Active Problem List   Diagnosis Date Noted  . Lumbar stenosis with neurogenic claudication 02/08/2015  . Right knee pain 11/02/2014  . Weakness 10/26/2014  . Sinus bradycardia 10/26/2014  . Diabetes mellitus type 2, controlled ([place]) 10/26/2014  . Chronic anemia 10/26/2014  . Thrombocytopenia ([place]) 10/26/2014  . Low back pain 10/26/2014  . Chronic kidney disease, stage III (moderate) 03/13/2013  . Essential hypertension 03/04/2013  . Other and unspecified hyperlipidemia 03/04/2013  . Diabetes mellitus, stable ([place]) 11/21/2012    Darrel Hoover PT May 23, 2015, 10:25 AM  [facility] [address] [place], [place], [address] Phone: [phone]   Fax:  [phone]  Name: Alexis Lopez MRN: [id number] Date of Birth: December 09, 1920    PHYSICAL THERAPY DISCHARGE SUMMARY  Visits from Start of Care: 14  Current functional level related to goals / functional outcomes: See above   Remaining deficits: See above He has improved but balance is a significant limiting factor   Education / Equipment: HEP  Plan: Patient agrees to discharge.  Patient  goals were partially met. Patient is being discharged due to                                                     ?????   Pt moving out of town

## 2015-05-09 ENCOUNTER — Ambulatory Visit: Payer: 59

## 2015-05-11 ENCOUNTER — Encounter: Payer: 59 | Admitting: Physical Therapy

## 2015-05-18 ENCOUNTER — Encounter: Payer: 59 | Admitting: Physical Therapy

## 2016-03-16 IMAGING — CT CT HEAD W/O CM
2 series · 16 of 30 positions shown, 20 images · non-contrast
Comparison: 04/11/2012.

CLINICAL DATA: Generalized weakness. Difficulty walking. Fall
several days ago.

EXAM:
CT HEAD WITHOUT CONTRAST
TECHNIQUE: Contiguous axial images were obtained from the base of the skull
through the vertex without intravenous contrast.

[Series 2: head 5.0 h30s · axial · 0.43mm/px · z∈[-61,+69]mm · 13 of 32 slices shown, 17 images]
[im 3/32  brain]
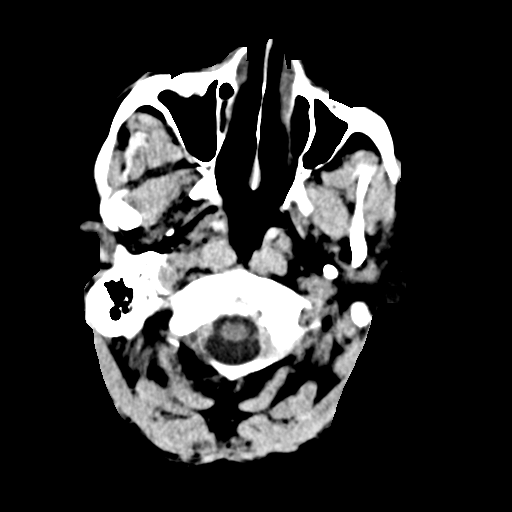
[im 3/32  bone]
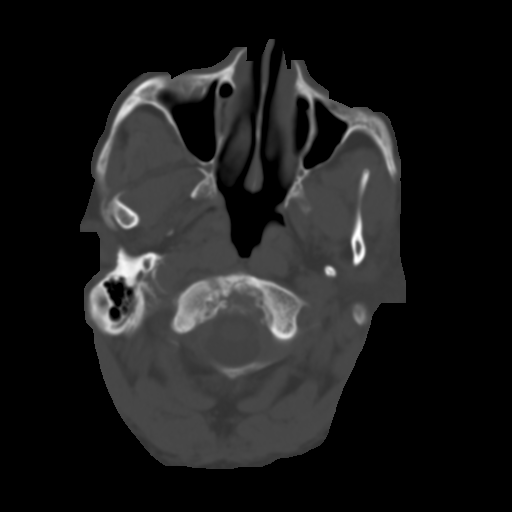
[im 5/32  brain]
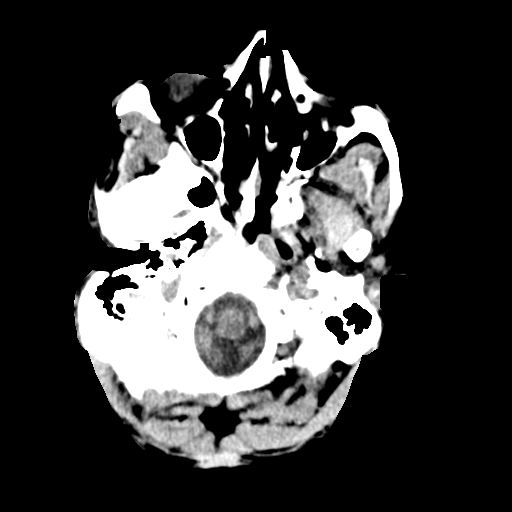
[im 7/32  brain]
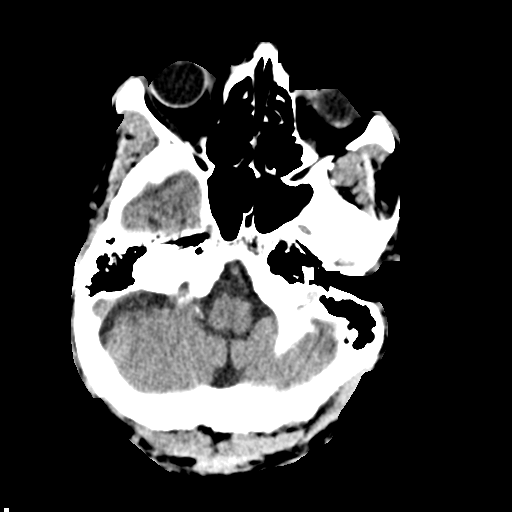
[im 9/32  brain]
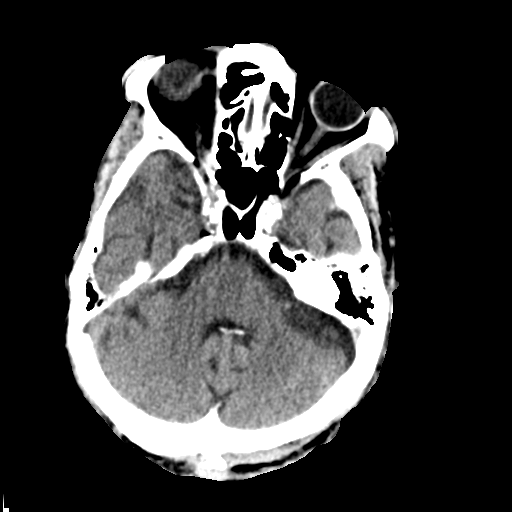
[im 12/32  brain]
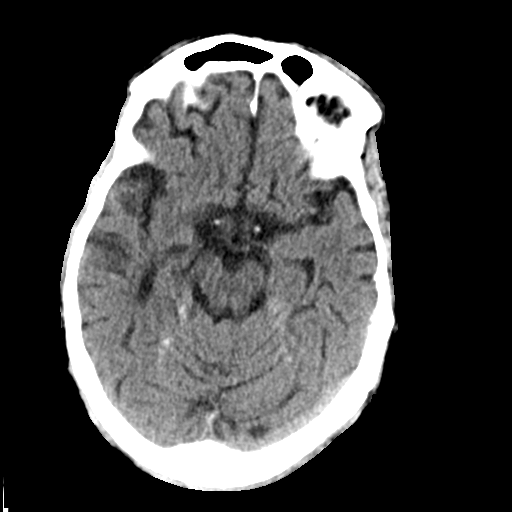
[im 12/32  bone]
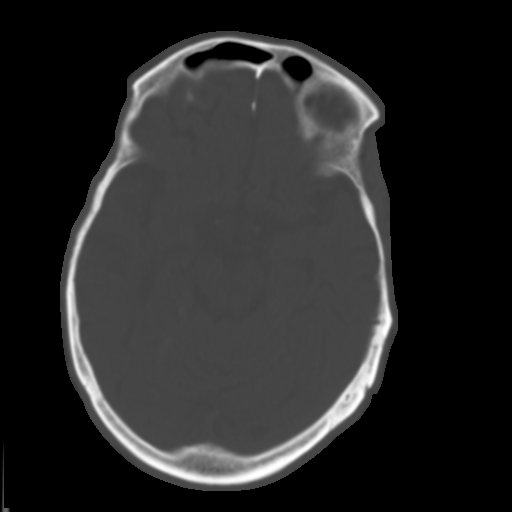
[im 14/32  brain]
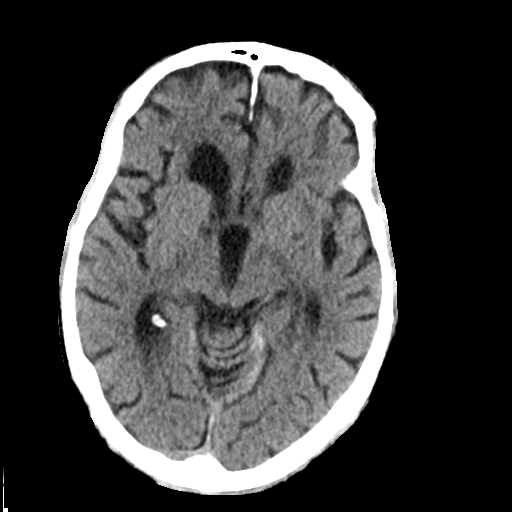
[im 16/32  brain]
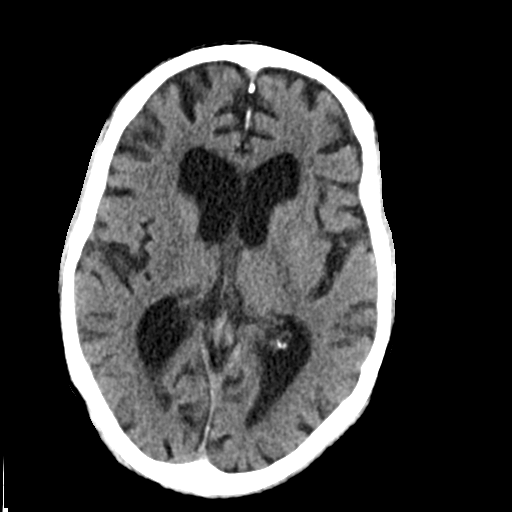
[im 18/32  brain]
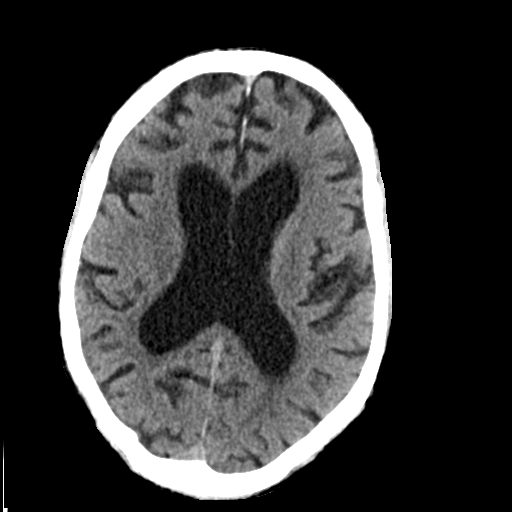
[im 20/32  brain]
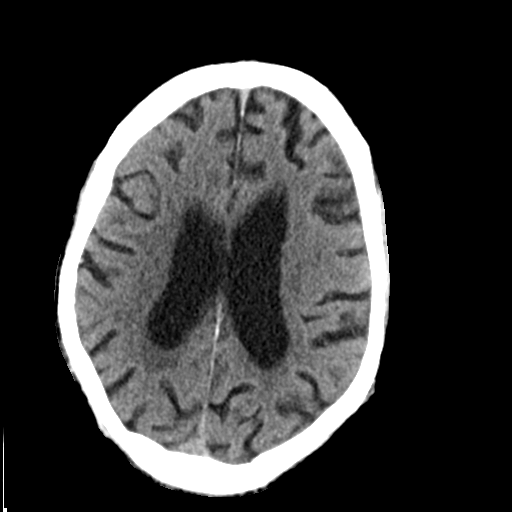
[im 20/32  bone]
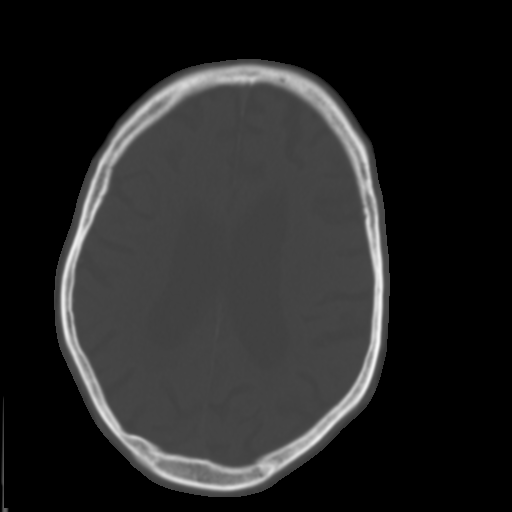
[im 23/32  brain]
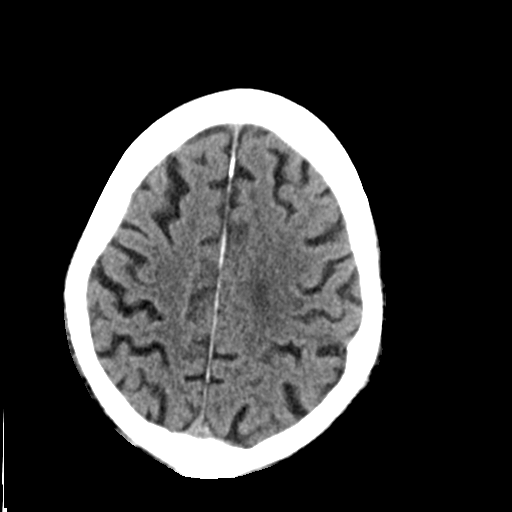
[im 25/32  brain]
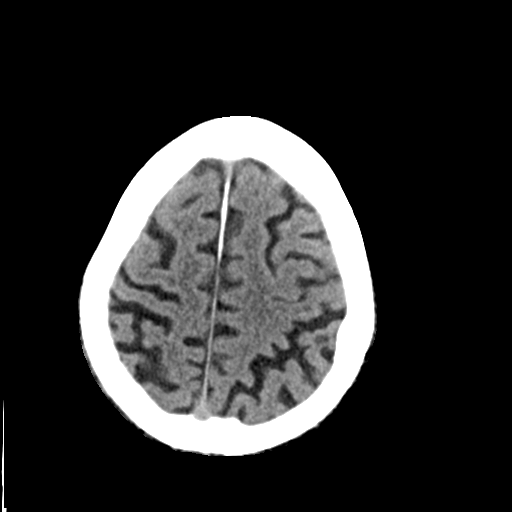
[im 27/32  brain]
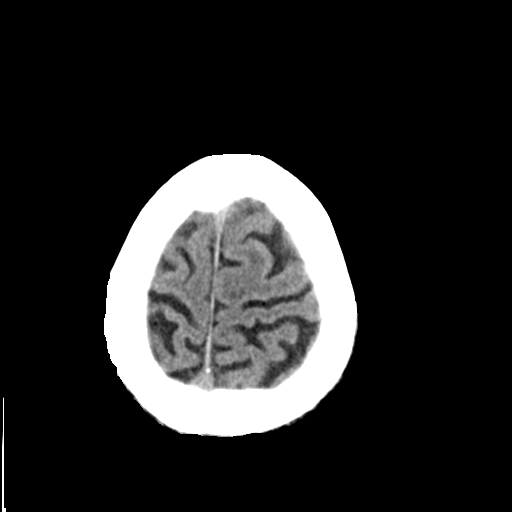
[im 29/32  brain]
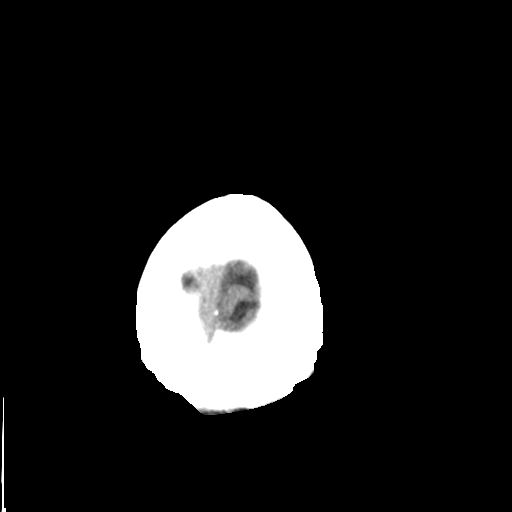
[im 29/32  bone]
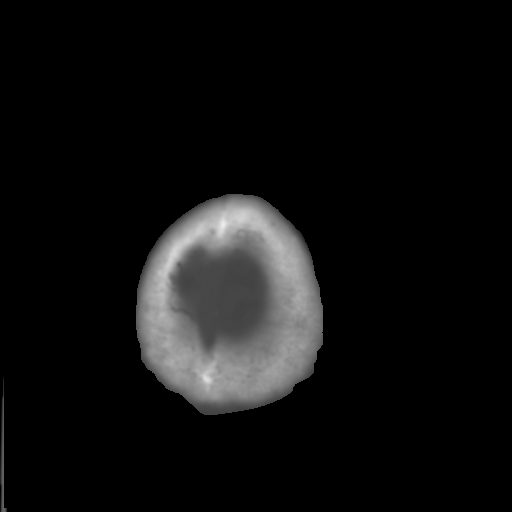

[Series 4: head 5.0 mpr · axial · 0.43mm/px · z∈[-129,-87]mm · 3 of 33 slices shown]
[im 3/33  brain]
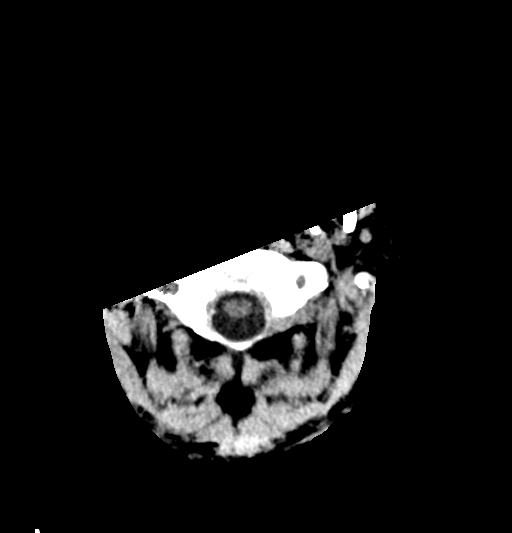
[im 7/33  brain]
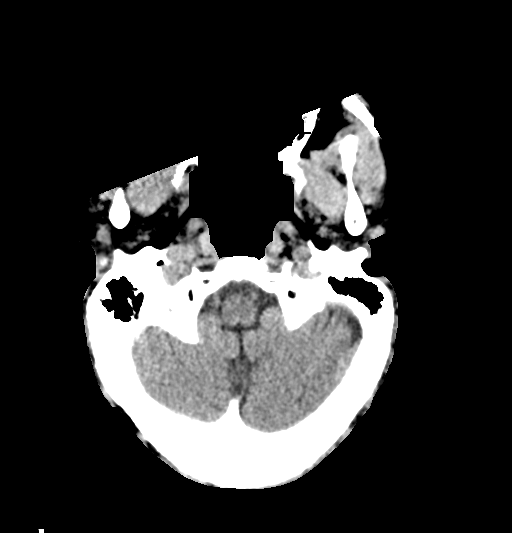
[im 12/33  brain]
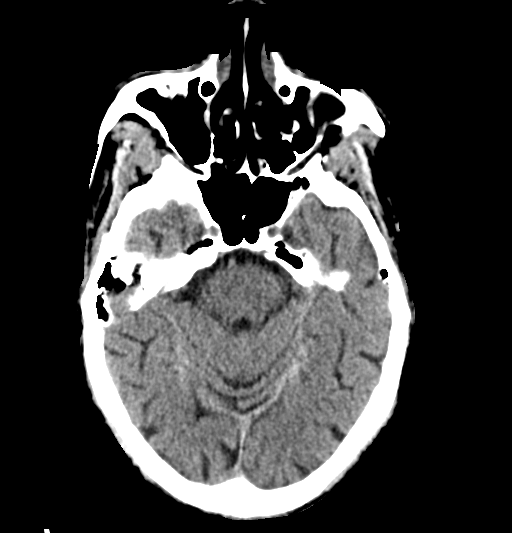

[16 of 30 positions shown; findings below may reference images not displayed]

FINDINGS: No mass lesion, mass effect, midline shift, hydrocephalus,
hemorrhage. No acute territorial cortical ischemia/infarct. Atrophy
and chronic ischemic white matter disease is present. Dystrophic
calcification is present at the a atrium of the LEFT lateral
ventricle. This is a chronic finding. Severe RIGHT temporomandibular
joint osteoarthritis. RIGHT lens extraction.
IMPRESSION: Atrophy and chronic ischemic white matter disease without acute
intracranial abnormality.

## 2016-03-16 IMAGING — MR MR LUMBAR SPINE W/O CM
4 of 5 series · 16 of 48 positions shown · non-contrast
Comparison: Prior radiograph from earlier the same day.

CLINICAL DATA: Initial evaluation for acute weakness, low back
pain, difficulty with ambulation.

EXAM:
MRI LUMBAR SPINE WITHOUT CONTRAST
TECHNIQUE: Multiplanar, multisequence MR imaging of the lumbar spine was
performed. No intravenous contrast was administered.

[Series 200: T2 · sagittal · 4.0mm · 0.55mm/px · 4 of 13 slices shown (1 of 2)]
[im 1/13]
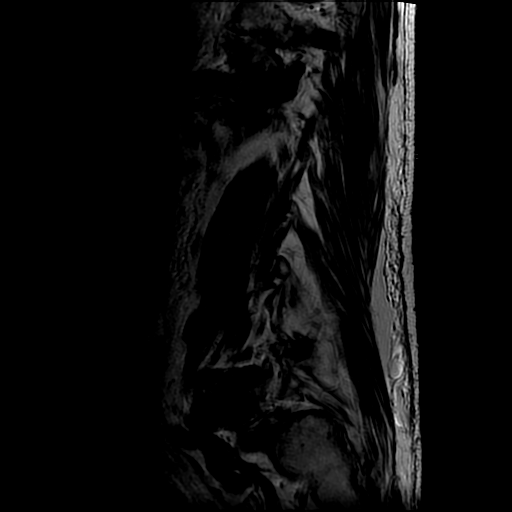
[im 5/13]
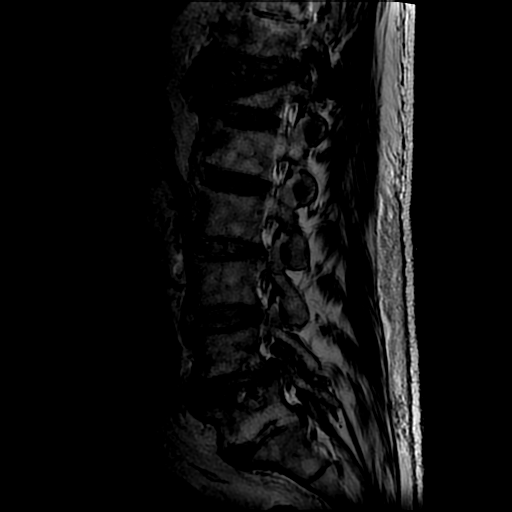
[im 9/13]
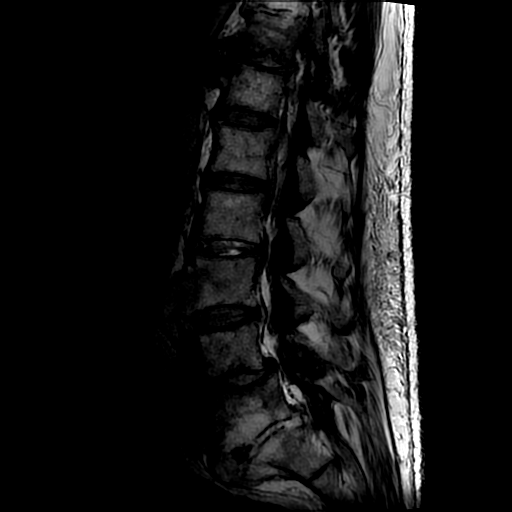
[im 13/13]
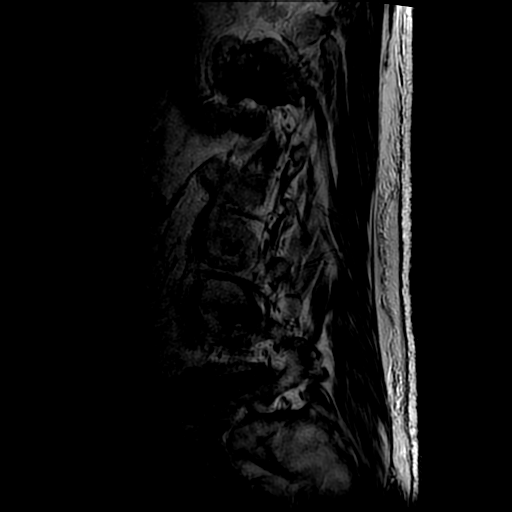

[Series 400: T1 · sagittal · 4.0mm · 0.55mm/px · 3 of 13 slices shown (1 of 2)]
[im 1/13]
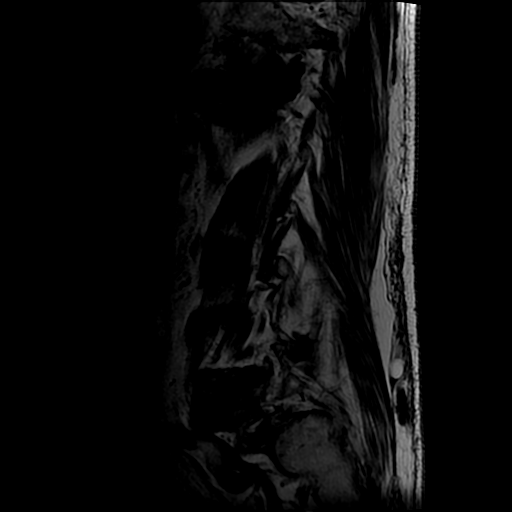
[im 7/13]
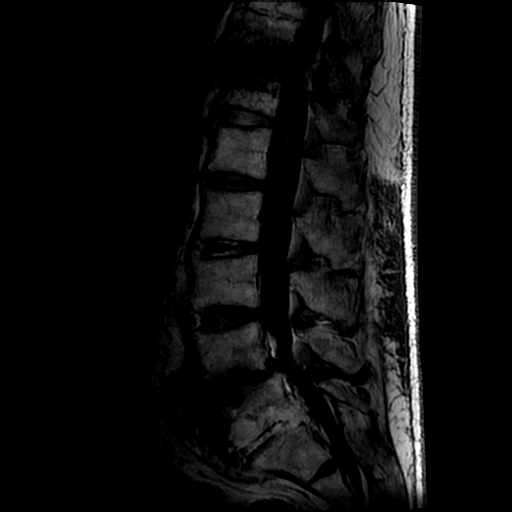
[im 13/13]
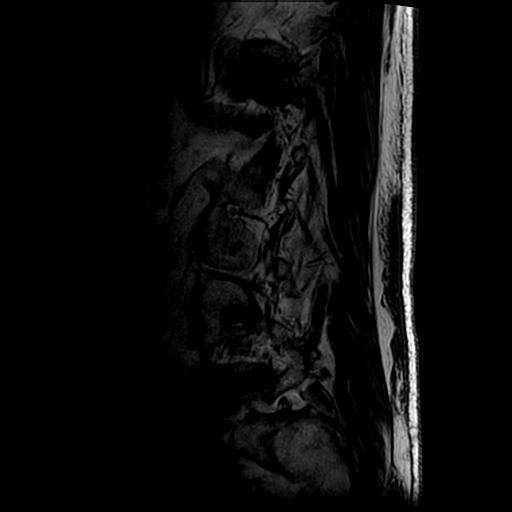

[Series 500: T2 · axial · 4.0mm · 0.39mm/px · z∈[-107,+110]mm · 6 of 47 slices shown (2 of 2)]
[im 3/47]
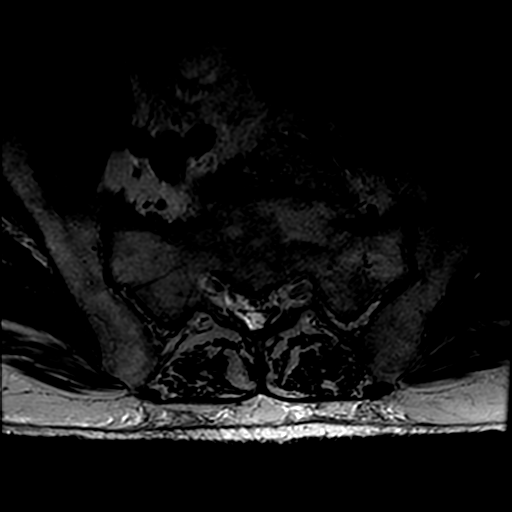
[im 6/47]
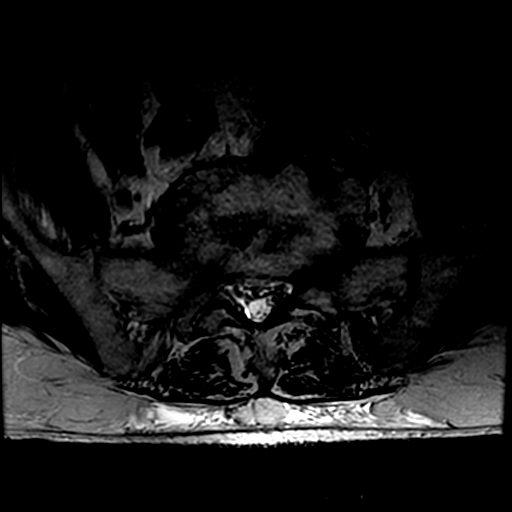
[im 9/47]
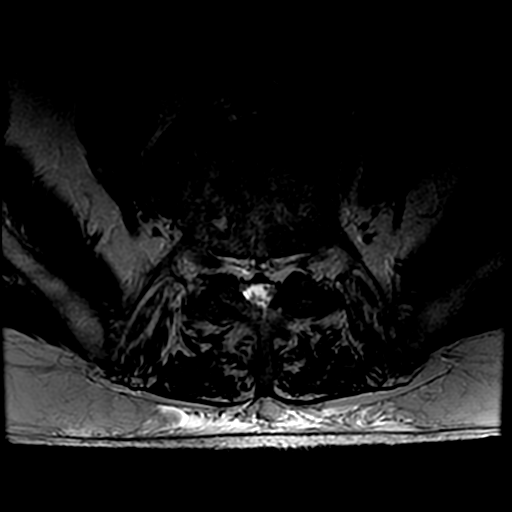
[im 15/47]
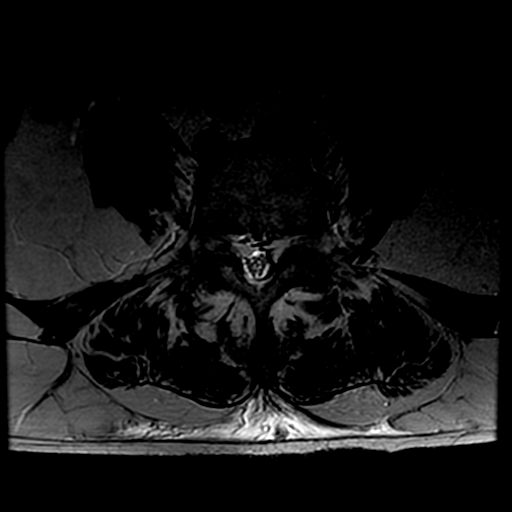
[im 24/47]
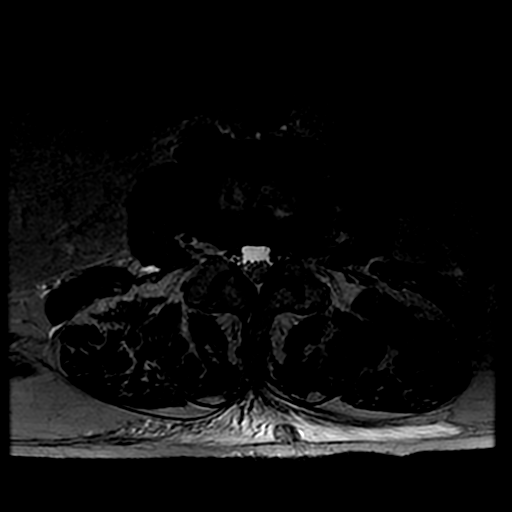
[im 41/47]
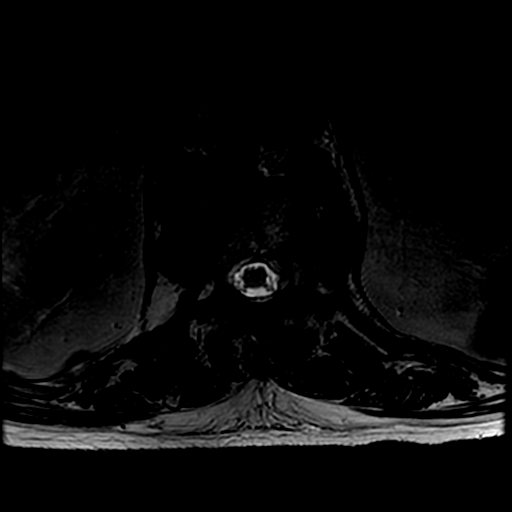

[Series 600: T1 · axial · 4.0mm · 0.39mm/px · z∈[-92,+110]mm · 3 of 47 slices shown (2 of 2)]
[im 6/47]
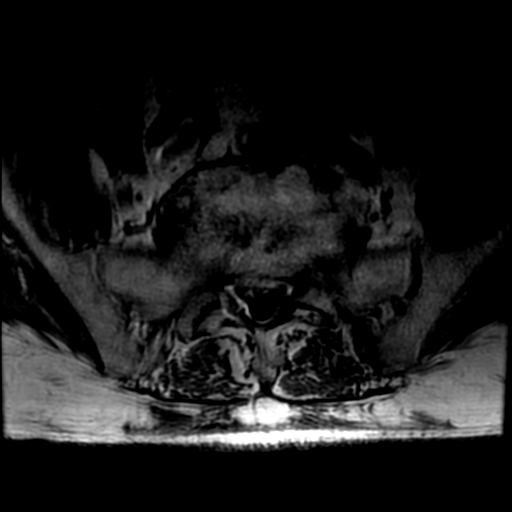
[im 24/47]
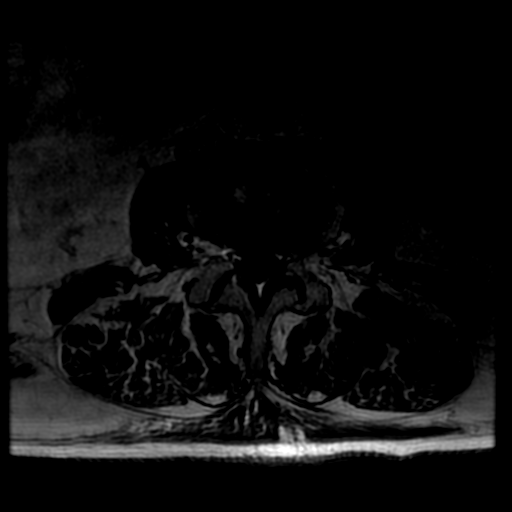
[im 41/47]
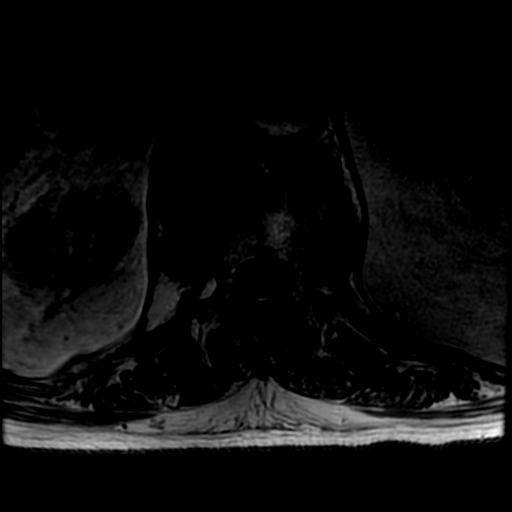

[16 of 48 positions shown; findings below may reference images not displayed]

FINDINGS: For the purposes of this dictation, the lowest well-formed
intervertebral disc spaces presumed to be the L5-S1 level, and there
presumed to be 5 lumbar type vertebral bodies. There is transitional
lumbosacral anatomy with partial sacralization of the L5 vertebral
body. The L5-S1 disc is rudimentary.

There is 4 mm anterolisthesis of L4 on L5. Otherwise, vertebral
bodies are normally aligned with preservation of the normal lumbar
lordosis.

There is anterior and mid height loss along the right aspect of the
superior endplate of T12. Height loss measures up to 80% of height
loss. This appears late subacute to chronic in nature. Prominent
degenerative endplate changes present within the adjacent inferior
endplate of T11 with associated degenerative Schmorl's node.
Prominent anterior osteophytic spurring present at this level.
Otherwise, vertebral body heights are preserved.

Conus medullaris terminates normally at the L1-2 level. Signal
intensity within the visualized cord is normal.

No acute paraspinous soft tissue abnormality. Aneurysmal dilatation
of the intra-abdominal aorta at to 3.7 cm. Scattered cyst noted
within the partially visualized kidneys bilaterally.

T10-11: Seen only on sagittal projection. Degenerative
intervertebral disc space narrowing with intervertebral disc space
height loss and mild diffuse disc bulge. Mild canal stenosis.
Foramina are grossly patent.

T11-12: Height loss along the right aspect of the superior endplate
of T12 of up to 80%. No significant bony retropulsion. Degenerative
endplate changes at the inferior endplate of T11 with associated
Schmorl's node. Prominent bulky anterior osteophytic spurring,
abutting the posterior aspect of the aorta. There is superimposed
diffuse disc bulging at this level. Severe bilateral facet
arthrosis. There is a superimposed synovial cyst arising from the
anteromedial aspect of the left T11-12 facet that measures 0.5 x
x 1.0 cm (series 500, image 5). There is resultant severe canal
stenosis at this level with compression of the distal spinal cord.
Thecal sac measures 4 mm at its most narrow point. No associated
cord signal changes. Severe bilateral foraminal narrowing present as
well.

T12-L1: No disc bulge or disc protrusion. Mild disc desiccation.
Mild bilateral facet hypertrophy. No significant stenosis.

L1-2: No significant disc bulge or disc protrusion. Mild disc
desiccation. Mild facet hypertrophy. No significant canal or
foraminal stenosis.

L2-3: Small left foraminal disc protrusion without associated neural
impingement (series 500, image 24). Facet and ligamentous
hypertrophy. Resultant mild canal and lateral recess stenosis.
Foramina remain widely patent.

L3-4: Mild diffuse annular disc bulge without focal disc herniation.
Predominately left-sided endplate osteophytic spurring. Mild facet
and ligamentous hypertrophy. No significant canal or foraminal
stenosis.

L4-5: 4 mm anterolisthesis of L4 on L5. There is associated diffuse
disc bulge with severe bilateral facet arthrosis and ligamentum
flavum hypertrophy. There is resultant severe canal stenosis with
the thecal sac measuring approximately 6 mm in AP diameter at its
most narrow point. Epidural lipomatosis within the ventral epidural
space posterior to the L4 vertebral body. Mild bilateral foraminal
narrowing present as well.

L5-S1: Somewhat rudimentary L5-S1 disc. Prominent degenerative
endplate changes with mild diffuse disc bulge. Mild bilateral facet
hypertrophy. No significant canal stenosis. Mild right foraminal
narrowing.
IMPRESSION: 1. Multifactorial degenerative changes with resultant severe canal
stenosis and cord compression at the level of T11-12 as detailed
above. No associated cord signal changes.
2. 4 mm anterolisthesis of L4 on L5 with associated disc bulge and
severe facet arthrosis, resulting in severe canal stenosis at this
level.
3. Approximately 80% of height loss at the right aspect of the
superior endplate of T12, likely late subacute to chronic in nature.
Vertebral body heights otherwise preserved.
4. Additional more mild multilevel degenerative changes as detailed
above.
5. Transitional lumbosacral anatomy with partial sacralization of
the L5 vertebral body. Careful correlation with the numbering system
on this exam and prior plain film radiographs recommended prior to
any potential future surgical intervention.
6. Intra-abdominal aortic aneurysm measuring up to 3.6 cm in
diameter.
Critical Value/emergent results were called by telephone at the time
of interpretation on 10/26/2014 at [DATE] to Dr. DON LOLITO ONACRAM ,
who verbally acknowledged these results.

## 2019-11-18 DEATH — deceased
